# Patient Record
Sex: Female | Born: 2000 | Race: White | Hispanic: No | Marital: Single | Smoking: Never smoker
Health system: Southern US, Community
[De-identification: ages and names within clinical notes are randomized; demographics above are authoritative.]

## PROBLEM LIST (undated history)

## (undated) DIAGNOSIS — E785 Hyperlipidemia, unspecified: Secondary | ICD-10-CM

## (undated) DIAGNOSIS — Z789 Other specified health status: Secondary | ICD-10-CM

## (undated) DIAGNOSIS — F419 Anxiety disorder, unspecified: Secondary | ICD-10-CM

## (undated) HISTORY — DX: Hyperlipidemia, unspecified: E78.5

## (undated) HISTORY — DX: Anxiety disorder, unspecified: F41.9

## (undated) HISTORY — DX: Other specified health status: Z78.9

## (undated) HISTORY — PX: WISDOM TOOTH EXTRACTION: SHX21

---
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
## 2020-01-03 ENCOUNTER — Telehealth (INDEPENDENT_AMBULATORY_CARE_PROVIDER_SITE_OTHER): Payer: BC Managed Care – PPO | Admitting: Pediatrics

## 2020-01-03 DIAGNOSIS — F4323 Adjustment disorder with mixed anxiety and depressed mood: Secondary | ICD-10-CM | POA: Diagnosis not present

## 2020-01-03 DIAGNOSIS — F5000 Anorexia nervosa, unspecified: Secondary | ICD-10-CM | POA: Diagnosis not present

## 2020-01-03 DIAGNOSIS — G479 Sleep disorder, unspecified: Secondary | ICD-10-CM

## 2020-01-03 MED ORDER — HYDROXYZINE HCL 25 MG PO TABS: 25.0000 mg | ORAL_TABLET | Freq: Three times a day (TID) | ORAL | 1 refills | Status: DC | PRN

## 2020-01-03 MED ORDER — FLUOXETINE HCL 40 MG PO CAPS: 40.0000 mg | ORAL_CAPSULE | Freq: Every day | ORAL | 1 refills | Status: DC

## 2020-01-03 NOTE — Progress Notes (Signed)
THIS RECORD MAY CONTAIN CONFIDENTIAL INFORMATION THAT SHOULD NOT BE RELEASED WITHOUT REVIEW OF THE SERVICE PROVIDER.  Virtual Follow-Up Visit via Video Note  I connected with Melody [name] 's mother and patient  on 01/03/20 at  4:00 PM EDT by a video enabled telemedicine application and verified that I am speaking with the correct person using two identifiers.   Patient/parent location: home   I discussed the limitations of evaluation and management by telemedicine and the availability of in person appointments.  I discussed that the purpose of this telehealth visit is to provide medical care while limiting exposure to the novel coronavirus.  The mother and patient expressed understanding and agreed to proceed.   Melody [name] is a 19 y.o. female referred by Chrys Racer, MD here today for follow-up of anorexia, anxiety, sleep disturbance.  Previsit planning completed:  yes   History was provided by the patient and mother.  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   When she is at home she is fine, but when she leaves the house, she has significant nausea and feels like she will hyperventilate. Mom says they tried to go somewhere in [place], and nearly had to pull over the car she was such a mess.   Sleeping pretty well- still night sweats. Sometimes lays awake but not frequently. Still wakes a few times, but falls asleep.   Food has been "pretty good." Things somewhat thrown off with school being restarted back in person- she is not able to snack like she was before. She doesn't feel like she's avoiding eating or missing meals.   24 hour recall:  D: baked potato, butter, cheese, [place]  S: dried cereal with m&ms B: yogurt and bagel thin with butter  L: oranges, [place], peas Handful of m&ms  Has felt like this  is the right amount.   Has been exercising and doing weight lifting most days. Wants to be back in shape for sports. Will drink chocolate milk and protein after, and then dinner.   Periods "come whenever they want to." Sometimes once a month, sometimes every other week. Fairly light. Period about 1 month ago, and then started again.   PHQ-SADS Last 3 Score only 01/03/2020 10/27/2017 08/27/2017  PHQ-15 Score 7 3 8   Total GAD-7 Score 12 0 8  Score 0 2 3      Review of Systems  Constitutional: Negative for malaise/fatigue.  Eyes: Negative for double vision.  Respiratory: Negative for shortness of breath.   Cardiovascular: Negative for chest pain and palpitations.  Gastrointestinal: Negative for abdominal pain, constipation, diarrhea, nausea and vomiting.  Genitourinary: Negative for dysuria.  Musculoskeletal: Negative for joint pain and myalgias.  Skin: Negative for rash.  Neurological: Positive for headaches. Negative for dizziness.  Endo/Heme/Allergies: Does not bruise/bleed easily.  Psychiatric/Behavioral: The patient is nervous/anxious and has insomnia.      No Known Allergies Outpatient Medications Prior to Visit  Medication Sig Dispense Refill  . FLUoxetine (PROZAC) 20 MG capsule TAKE 1 CAPSULE DAILY 90 capsule 3  . ibuprofen (ADVIL,MOTRIN) 200 MG tablet Take 200 mg by mouth every 6 (six) hours as needed.     No facility-administered medications prior to visit.     Patient Active Problem List   Diagnosis Date Noted  . Sleep  disturbance 05/19/2018  . Headache in pediatric patient 05/19/2018  . Vaginal discharge 08/27/2017  . Disordered eating 01/06/2017  . Adjustment disorder with mixed anxiety and depressed mood 01/06/2017    Tobacco?  no Drugs/ETOH?  no Partner preference?  female Sexually Active?  no  Pregnancy Prevention:  none, reviewed condoms & plan B Trauma currently or in the pastt?  no Suicidal or Self-Harm thoughts?   no Guns in the home?  no  The  following portions of the patient's history were reviewed and updated as appropriate: allergies, current medications, past family history, past medical history, past social history, past surgical history and problem list.  Visual Observations/Objective:   General Appearance: Well nourished well developed, in no apparent distress.  Eyes: conjunctiva no swelling or erythema ENT/Mouth: No hoarseness, No cough for duration of visit.  Neck: Supple  Respiratory: Respiratory effort normal, normal rate, no retractions or distress.   Cardio: Appears well-perfused, noncyanotic Musculoskeletal: no obvious deformity Skin: visible skin without rashes, ecchymosis, erythema Neuro: Awake and oriented X 3,  Psych:  normal affect, Insight and Judgment appropriate.    Assessment/Plan: 1. Adjustment disorder with mixed anxiety and depressed mood Increase fluoxetine to 40 mg daily. She will use hydroxyzine as needed in the meantime. Not currently interested in counseling. She should start to see some benefit in the coming weeks.  - FLUoxetine (PROZAC) 40 MG capsule; Take 1 capsule (40 mg total) by mouth daily.  Dispense: 90 capsule; Refill: 1 - hydrOXYzine (ATARAX/VISTARIL) 25 MG tablet; Take 1 tablet (25 mg total) by mouth 3 (three) times daily as needed.  Dispense: 30 tablet; Refill: 1  2. Anorexia nervosa Stable.   3. Sleep disturbance Stable- will monitor night sweats as we increase medication.     I discussed the assessment and treatment plan with the patient and/or parent/guardian.  They were provided an opportunity to ask questions and all were answered.  They agreed with the plan and demonstrated an understanding of the instructions. They were advised to call back or seek an in-person evaluation in the emergency room if the symptoms worsen or if the condition fails to improve as anticipated.   Follow-up:  4 weeks   Medical decision-making:   I spent 25 minutes on this telehealth visit  inclusive of face-to-face video and care coordination time I was located off site during this encounter.   Jonathon Resides, FNP    CC: Randel Books, Tivis Ringer, MD, Luna Fuse, MD

## 2020-01-04 ENCOUNTER — Encounter: Payer: Self-pay | Admitting: Pediatrics

## 2020-01-29 ENCOUNTER — Other Ambulatory Visit: Payer: Self-pay

## 2020-01-29 ENCOUNTER — Encounter: Payer: Self-pay | Admitting: Pediatrics

## 2020-01-29 ENCOUNTER — Ambulatory Visit (INDEPENDENT_AMBULATORY_CARE_PROVIDER_SITE_OTHER): Payer: BC Managed Care – PPO | Admitting: Pediatrics

## 2020-01-29 VITALS — BP 114/71 | HR 67 | Ht 62.0 in | Wt 100.4 lb

## 2020-01-29 DIAGNOSIS — Z975 Presence of (intrauterine) contraceptive device: Secondary | ICD-10-CM | POA: Diagnosis not present

## 2020-01-29 DIAGNOSIS — G479 Sleep disorder, unspecified: Secondary | ICD-10-CM

## 2020-01-29 DIAGNOSIS — Z1389 Encounter for screening for other disorder: Secondary | ICD-10-CM | POA: Diagnosis not present

## 2020-01-29 DIAGNOSIS — F4323 Adjustment disorder with mixed anxiety and depressed mood: Secondary | ICD-10-CM | POA: Diagnosis not present

## 2020-01-29 DIAGNOSIS — F509 Eating disorder, unspecified: Secondary | ICD-10-CM

## 2020-01-29 LAB — POCT URINALYSIS DIPSTICK
Bilirubin, UA: NEGATIVE
Blood, UA: NEGATIVE
Glucose, UA: NEGATIVE
Ketones, UA: NEGATIVE
Leukocytes, UA: NEGATIVE
Nitrite, UA: NEGATIVE
Protein, UA: NEGATIVE
Spec Grav, UA: 1.01 (ref 1.010–1.025)
Urobilinogen, UA: NEGATIVE E.U./dL — AB
pH, UA: 8 (ref 5.0–8.0)

## 2020-01-29 NOTE — Progress Notes (Signed)
History was provided by the patient and mother.  Melody [name] is a 19 y.o. female who is here for disordered eating, anxiety, sleep disturbance.   Luna Fuse, MD   HPI:  Pt reports that things are fairy good. She hasn't gone much of anywhere except school, but she hasn't felt like vomiting or hyperventilating. Over the weekend was at dad's and drove a friend's house, and didn't worry at all. Hasn't needed hydroxyzine in the past few weeks   She is going to [employer] in the fall. 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 She got a great scholarship.   She is going to [place] on a family vacation. Has had COVID vaccine. Has 3 AP exams this week and 1 in two weeks.   Trying to gain weight by increasing protein and lifting weights at the gym.   Sleeping pretty well. Goes to bed around 11 pm and getting up at 6 am. Sleeping through the night.   Still has nexplanon. It is light, but comes whenever it wants which is a little annoying but not bad. Just spotting basically.   PHQ-SADS Last 3 Score only 01/29/2020 01/03/2020 10/27/2017  PHQ-15 Score 2 7 3   Total GAD-7 Score 3 12 0  Score 1 0 2     Patient's last menstrual period was 01/03/2020.  Review of Systems  Constitutional: Negative for malaise/fatigue.  Eyes: Negative for double vision.  Respiratory: Negative for shortness of breath.   Cardiovascular: Negative for chest pain and palpitations.  Gastrointestinal: Negative for abdominal pain, constipation, diarrhea, nausea and vomiting.  Genitourinary: Negative for dysuria.  Musculoskeletal: Negative for joint pain and myalgias.  Skin: Negative for rash.  Neurological: Negative for dizziness and headaches.  Endo/Heme/Allergies: Does not bruise/bleed easily.    Patient Active Problem List   Diagnosis Date Noted  . Family history of breast cancer 08/04/2019  . Sleep disturbance 05/19/2018  . Headache in pediatric patient 05/19/2018  . Vaginal discharge 08/27/2017  . Disordered  eating 01/06/2017  . Adjustment disorder with mixed anxiety and depressed mood 01/06/2017    Current Outpatient Medications on File Prior to Visit  Medication Sig Dispense Refill  . FLUoxetine (PROZAC) 40 MG capsule Take 1 capsule (40 mg total) by mouth daily. 90 capsule 1  . hydrOXYzine (ATARAX/VISTARIL) 25 MG tablet Take 1 tablet (25 mg total) by mouth 3 (three) times daily as needed. 30 tablet 1  . ibuprofen (ADVIL,MOTRIN) 200 MG tablet Take 200 mg by mouth every 6 (six) hours as needed.     No current facility-administered medications on file prior to visit.    No Known Allergies   Physical Exam:    Vitals:   01/29/20 1521  BP: 114/71  Pulse: 67  Weight: 100 lb 6.4 oz (45.5 kg)  Height: 5\' 2"  (1.575 m)    Blood pressure percentiles are not available for patients who are 18 years or older.  Physical Exam Vitals and nursing note reviewed.  Constitutional:      General: She is not in acute distress.    Appearance: She is well-developed.  Neck:     Thyroid: No thyromegaly.  Cardiovascular:     Rate and Rhythm: Normal rate and regular rhythm.     Heart sounds: No murmur.  Pulmonary:     Breath sounds: Normal breath sounds.  Abdominal:     Palpations: Abdomen is soft. There is no mass.     Tenderness: There is no abdominal tenderness. There is no guarding.  Musculoskeletal:     Right lower leg: No edema.     Left lower leg: No edema.  Lymphadenopathy:     Cervical: No cervical adenopathy.  Skin:    General: Skin is warm.     Findings: No rash.  Neurological:     Mental Status: She is alert.     Comments: No tremor  Psychiatric:        Mood and Affect: Mood and affect normal.     Assessment/Plan: 1. Adjustment disorder with mixed anxiety and depressed mood PHQSADs is much improved today. Continue fluoxetine 40 mg daily and hydroxyzine as needed for significant anxiety.   2. Eating disorder, unspecified type Doing well, is now focused on gaining weight and  muscle appropriately. Discussed major life changes can trigger desire to restrict, so will watch closely.   3. Sleep disturbance Improved with increase in fluoxetine.   4. Nexplanon in place Stable, minor spotting but overall fine.   5. Screening for genitourinary condition WNL  - POCT urinalysis dipstick   Alfonso Ramus, FNP

## 2020-01-29 NOTE — Patient Instructions (Signed)
-

## 2020-04-02 ENCOUNTER — Encounter: Payer: Self-pay | Admitting: Pediatrics

## 2020-04-02 ENCOUNTER — Ambulatory Visit (INDEPENDENT_AMBULATORY_CARE_PROVIDER_SITE_OTHER): Payer: BC Managed Care – PPO | Admitting: Pediatrics

## 2020-04-02 VITALS — BP 116/71 | HR 67 | Ht 62.0 in | Wt 105.0 lb

## 2020-04-02 DIAGNOSIS — Z3046 Encounter for surveillance of implantable subdermal contraceptive: Secondary | ICD-10-CM

## 2020-04-02 DIAGNOSIS — F4323 Adjustment disorder with mixed anxiety and depressed mood: Secondary | ICD-10-CM | POA: Diagnosis not present

## 2020-04-02 DIAGNOSIS — G479 Sleep disorder, unspecified: Secondary | ICD-10-CM

## 2020-04-02 DIAGNOSIS — Z30011 Encounter for initial prescription of contraceptive pills: Secondary | ICD-10-CM

## 2020-04-02 DIAGNOSIS — Z1389 Encounter for screening for other disorder: Secondary | ICD-10-CM

## 2020-04-02 LAB — POCT URINALYSIS DIPSTICK
Bilirubin, UA: NEGATIVE
Blood, UA: NEGATIVE
Glucose, UA: NEGATIVE
Ketones, UA: NEGATIVE
Leukocytes, UA: NEGATIVE
Nitrite, UA: NEGATIVE
Protein, UA: NEGATIVE
Spec Grav, UA: 1.01 (ref 1.010–1.025)
Urobilinogen, UA: NEGATIVE E.U./dL — AB
pH, UA: 5 (ref 5.0–8.0)

## 2020-04-02 MED ORDER — NORETHINDRONE 0.35 MG PO TABS: 1.0000 | ORAL_TABLET | Freq: Every day | ORAL | 0 refills | Status: DC

## 2020-04-02 MED ORDER — FLUOXETINE HCL 40 MG PO CAPS: 40.0000 mg | ORAL_CAPSULE | Freq: Every day | ORAL | 1 refills | Status: DC

## 2020-04-02 MED ORDER — NORETHINDRONE 0.35 MG PO TABS: 1.0000 | ORAL_TABLET | Freq: Every day | ORAL | 2 refills | Status: DC

## 2020-04-02 NOTE — Patient Instructions (Signed)
We will see you in 3 months   Start progesterone only pill- if it is not working well for you and you continue to have unpredictable bleeding, let us know.   Your Nexplanon was removed today and is no longer preventing pregnancy.  If you have sex, remember to use condoms to prevent pregnancy and to prevent sexually transmitted infections.  Leave the outside bandage on for 24 hours.  Leave the smaller bandages on for 3-5 days or until they fall off on their own.  Keep the area clean and dry for 3-5 days.  There is usually bruising or swelling at and around the removal site for a few days to a week after the removal.  If you see redness or pus draining from the removal site, call us immediately.  We would like you to return to the clinic for a follow-up visit in 1 month.  You can call [facility] 24 hours a day with any questions or concerns.  There is always a nurse or doctor available to take your call.  Call 9-1-1 if you have a life-threatening emergency.  For anything else, please call us at [phone] before heading to the ER.

## 2020-04-02 NOTE — Progress Notes (Signed)

## 2020-04-02 NOTE — Progress Notes (Signed)
This note is not being shared with the patient for the following reason: To respect privacy (The patient or proxy has requested that the information not be shared).  THIS RECORD MAY CONTAIN CONFIDENTIAL INFORMATION THAT SHOULD NOT BE RELEASED WITHOUT REVIEW OF THE SERVICE PROVIDER.  Adolescent Medicine Consultation Follow-Up Visit Melody [name]  is a 19 y.o. female referred by Luna Fuse, MD here today for follow-up regarding medication follow up.   Pertinent Labs? No Growth Chart Viewed? yes   History was provided by the patient and mother.  Interpreter? no  Chief complaint: Medication follow up   HPI:    1. Wants Nexplanon remove Bleeding three times a month Bleeding for a week, then bleed for 4 days a week later, 2 days another week.  Periods are very light Unsure when period going to come and how long going to last  No pattern Nexplanon placed June 5th 2020,  Has never been regular since placement, sometimes coming a few days before Has been worsening in the past 6 months Unsure incident event that changed   Never been sexually active. No changes in vaginal discharge or odor. No itching.   She is interested in OCPs. Does have history of migraine with aura. No history of DVT in family. Maternal grandmother with breast cancer. No smoking.   Mom and aunt had problems with IUD. No interest in this.   2. Anxiety: Currently on Floxetine 54m. Has only needed hydroxyzine on long car trips as prophlatic. Feels like current dose is doing well. No community therapy. Has tried in the past did not like.   3. Sleep Disturbance: Sleeping well. Falling asleep and staying asleep.   [facility] excited and nervous. Met room mate. 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     No LMP recorded. No Known Allergies Current Outpatient Medications on File Prior to Visit  Medication Sig Dispense Refill  . etonogestrel (NEXPLANON) 68 MG IMPL  implant Inject into the skin.    . hydrOXYzine (ATARAX/VISTARIL) 25 MG tablet Take 1 tablet (25 mg total) by mouth 3 (three) times daily as needed. 30 tablet 1  . ibuprofen (ADVIL,MOTRIN) 200 MG tablet Take 200 mg by mouth every 6 (six) hours as needed.    .Marland KitchenSpecialty Vitamins Products (MAGNESIUM, AMINO ACID CHELATE,) 133 MG tablet Take by mouth.     No current facility-administered medications on file prior to visit.    Patient Active Problem List   Diagnosis Date Noted  . Nexplanon in place 01/29/2020  . Family history of breast cancer 08/04/2019  . Sleep disturbance 05/19/2018  . Headache in pediatric patient 05/19/2018  . Vaginal discharge 08/27/2017  . Disordered eating 01/06/2017  . Adjustment disorder with mixed anxiety and depressed mood 01/06/2017    Physical Exam:  Vitals:   04/02/20 1023  BP: 116/71  Pulse: 67  Weight: 105 lb (47.6 kg)  Height: _0  (1.575 m)   BP 116/71   Pulse 67   Ht _1  (1.575 m)   Wt 105 lb (47.6 kg)   BMI 19.20 kg/m  Body mass index: body mass index is 19.2 kg/m. Blood pressure percentiles are not available for patients who are 18 years or older.   Physical Exam  General: Alert, well-appearing female in NAD.  HEENT:              Head: Normocephalic, No signs of head trauma  Eyes: Sclerae are anicteric             Nose: clear             Throat: Good dentition, Moist mucous membranes.Oropharynx clear with no erythema or exudate Neck: normal range of motion, no lymphadenopathy, no thyromegaly Cardiovascular: Regular rate and rhythm, S1 and S2 normal. No murmur, rub, or gallop appreciated. Radial pulse +2 bilaterally Pulmonary: Normal work of breathing. Clear to auscultation bilaterally with no wheezes or crackles present, Cap refill <2 secs Abdomen: Normoactive bowel sounds. Soft, non-tender, non-distended. Extremities: Warm and well-perfused, without cyanosis or edema. Full ROM. Nexplanon present in left forearm.   Neurologic: No focal deficits appreciated Skin: acne vulgaris present on face  Assessment/Plan:  1. Adjustment disorder with mixed anxiety and depressed mood Good control of symptoms on current dose.  - FLUoxetine (PROZAC) 40 MG capsule; Take 1 capsule (40 mg total) by mouth daily.  Dispense: 90 capsule; Refill: 1  2. Screening for genitourinary condition - POCT urinalysis dipstick: negative  3. Nexplanon removal See procedural note.   4. Sleep disturbance Improved on current Fluoxetine dose. Has hydroxyzine prn. Has not needed to use dose.   5. Oral contraception initiation History of migraine with aura, will therefore avoid estrogen and progesterone combined OCP. Plan to start Progestin only. Education provided prior to initiation. BP wnl.  - norethindrone (CAMILA) 0.35 MG tablet; Take 1 tablet (0.35 mg total) by mouth daily.  Dispense: 30 tablet; Refill: 0 - norethindrone (CAMILA) 0.35 MG tablet; Take 1 tablet (0.35 mg total) by mouth daily.  Dispense: 90 tablet; Refill: 2    Follow-up:  Return in about 3 months (around 07/03/2020).

## 2020-04-03 NOTE — Progress Notes (Signed)
I have reviewed the resident's note and plan of care and helped develop the plan as necessary.  Undine is not a good candidate for E2 containing methods d/t migraine with aura. Can start with camila- consider drosperinone progestin only if needed if she becomes sexually active or is having btb.   Alfonso Ramus, FNP

## 2020-05-06 ENCOUNTER — Ambulatory Visit: Payer: Self-pay | Admitting: Pediatrics

## 2020-06-17 DIAGNOSIS — Z23 Encounter for immunization: Secondary | ICD-10-CM | POA: Diagnosis not present

## 2020-07-03 ENCOUNTER — Telehealth (INDEPENDENT_AMBULATORY_CARE_PROVIDER_SITE_OTHER): Payer: BC Managed Care – PPO | Admitting: Pediatrics

## 2020-07-03 DIAGNOSIS — F4323 Adjustment disorder with mixed anxiety and depressed mood: Secondary | ICD-10-CM

## 2020-07-03 DIAGNOSIS — G479 Sleep disorder, unspecified: Secondary | ICD-10-CM | POA: Diagnosis not present

## 2020-07-03 DIAGNOSIS — F509 Eating disorder, unspecified: Secondary | ICD-10-CM

## 2020-07-03 DIAGNOSIS — Z975 Presence of (intrauterine) contraceptive device: Secondary | ICD-10-CM | POA: Diagnosis not present

## 2020-07-03 DIAGNOSIS — R5383 Other fatigue: Secondary | ICD-10-CM

## 2020-07-03 DIAGNOSIS — K121 Other forms of stomatitis: Secondary | ICD-10-CM

## 2020-07-03 NOTE — Progress Notes (Signed)
THIS RECORD MAY CONTAIN CONFIDENTIAL INFORMATION THAT SHOULD NOT BE RELEASED WITHOUT REVIEW OF THE SERVICE PROVIDER.  Virtual Follow-Up Visit via Video Note  I connected with Melody [name] 's patient  on 07/03/20 at  1:30 PM EDT by a video enabled telemedicine application and verified that I am speaking with the correct person using two identifiers.   Patient/parent location: [place] dorm room   I discussed the limitations of evaluation and management by telemedicine and the availability of in person appointments.  I discussed that the purpose of this telehealth visit is to provide medical care while limiting exposure to the novel coronavirus.  The patient expressed understanding and agreed to proceed.   Melody [name] is a 19 y.o. female referred by Luna Fuse, MD here today for follow-up of anxiety, disordered eating.  Previsit planning completed:  yes   History was provided by the patient.  Plan from Last Visit:   Continue fluxoetine 40 mg daily, nexplanon and camila for BTB  Chief Complaint: F/u fatigue   History of Present Illness:  Is at [facility]. Is tired a lot. Taking naps during the day which she was not doing before. She goes to sleep easily, sometimes even with the lights on. Sleeping through the night. Going to bed 11-12 pm and getting up about 7 am. In high school went to bed earlier. Fatigue has been going on since school started.   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   24 hour recall:  B: mini pancakes  L: bagel  D: fruit snacks, mac and cheese, m&ms B: none  L: pretzel rolls, cheese, grapes and desert for lunch Drinking mostly water, 1 cup of coffee in the AM   Recognizes that she is missing a good bit of protein, fruits and veg.    Review of Systems  Constitutional: Positive for malaise/fatigue. Negative for fever and weight loss.  HENT: Positive for sore throat.   Eyes: Negative for double vision.   Respiratory: Positive for shortness of breath.   Cardiovascular: Negative for chest pain and palpitations.  Gastrointestinal: Negative for abdominal pain, constipation, diarrhea, nausea and vomiting.  Genitourinary: Negative for dysuria and frequency.  Musculoskeletal: Negative for joint pain and myalgias.  Skin: Negative for rash.  Neurological: Positive for headaches. Negative for dizziness.  Endo/Heme/Allergies: Does not bruise/bleed easily.  Psychiatric/Behavioral: Negative for depression. The patient is not nervous/anxious and does not have insomnia.      No Known Allergies Outpatient Medications Prior to Visit  Medication Sig Dispense Refill  . etonogestrel (NEXPLANON) 68 MG IMPL implant Inject into the skin.    Marland Kitchen FLUoxetine (PROZAC) 40 MG capsule Take 1 capsule (40 mg total) by mouth daily. 90 capsule 1  . hydrOXYzine (ATARAX/VISTARIL) 25 MG tablet Take 1 tablet (25 mg total) by mouth 3 (three) times daily as needed. 30 tablet 1  . ibuprofen (ADVIL,MOTRIN) 200 MG tablet Take 200 mg by mouth every 6 (six) hours as needed.    . norethindrone (CAMILA) 0.35 MG tablet Take 1 tablet (0.35 mg total) by mouth daily. 30 tablet 0  . norethindrone (CAMILA) 0.35 MG tablet Take 1 tablet (0.35 mg total) by mouth daily. 90 tablet 2  . Specialty Vitamins Products (MAGNESIUM, AMINO ACID CHELATE,) 133 MG tablet Take by mouth.     No facility-administered medications prior to visit.     Patient Active Problem List   Diagnosis Date Noted  . Nexplanon  in place 01/29/2020  . Family history of breast cancer 08/04/2019  . Sleep disturbance 05/19/2018  . Headache in pediatric patient 05/19/2018  . Vaginal discharge 08/27/2017  . Disordered eating 01/06/2017  . Adjustment disorder with mixed anxiety and depressed mood 01/06/2017    The following portions of the patient's history were reviewed and updated as appropriate: allergies, current medications, past family history, past medical history,  past social history, past surgical history and problem list.  Visual Observations/Objective:   General Appearance: Well nourished well developed, in no apparent distress.  Eyes: conjunctiva no swelling or erythema ENT/Mouth: No hoarseness, No cough for duration of visit.  Neck: Supple  Respiratory: Respiratory effort normal, normal rate, no retractions or distress.   Cardio: Appears well-perfused, noncyanotic Musculoskeletal: no obvious deformity Skin: visible skin without rashes, ecchymosis, erythema Neuro: Awake and oriented X 3,  Psych:  normal affect, Insight and Judgment appropriate.    Assessment/Plan: 1. Adjustment disorder with mixed anxiety and depressed mood PHQSADs is negative and stable. Continue fluoxetine 40 mg daily.   2. Sleep disturbance Sleeping well at night now overall.   3. Nexplanon in place Doing well without BTB with camila on board.   4. Eating disorder, unspecified type Though she doesn't seem to be intentionally restricting, her recall indicates that she is not getting enough intake, particularly of protein and fats. Suggested keeping clif bar and whole milk yogurt or greek yogurt in her room for daily breakfast since she usually misses this. Also suggested considering meal pattern to follow, but wants to focus on breakfast first.   5. Fatigue, unspecified type Will get labs to ensure she is not deficient in b12, ferritin or vit d which could be contributing to fatigue.  - B12 - Ferritin - VITAMIN D 25 Hydroxy (Vit-D Deficiency, Fractures)  6. Oral ulcer Likely apthous ulcer but will check B12 since she very rarely has red meat.  - B12   BH screenings:  PHQ-SADS Last 3 Score only 01/29/2020 01/03/2020 10/27/2017  PHQ-15 Score _0 Total GAD-7 Score 3 12 0  PHQ-9 Total Score 1 0 2    Screens discussed with patient and parent and adjustments to plan made accordingly.   I discussed the assessment and treatment plan with the patient and/or  parent/guardian.  They were provided an opportunity to ask questions and all were answered.  They agreed with the plan and demonstrated an understanding of the instructions. They were advised to call back or seek an in-person evaluation in the emergency room if the symptoms worsen or if the condition fails to improve as anticipated.   Follow-up:  Thanksgiving break in clinic   Medical decision-making:   I spent 25 minutes on this telehealth visit inclusive of face-to-face video and care coordination time I was located in clinic during this encounter.   Jonathon Resides, FNP    CC: Randel Books, Tivis Ringer, MD, Luna Fuse, MD

## 2020-08-20 ENCOUNTER — Ambulatory Visit (INDEPENDENT_AMBULATORY_CARE_PROVIDER_SITE_OTHER): Payer: BC Managed Care – PPO | Admitting: Pediatrics

## 2020-08-20 ENCOUNTER — Encounter: Payer: Self-pay | Admitting: Pediatrics

## 2020-08-20 ENCOUNTER — Other Ambulatory Visit (HOSPITAL_COMMUNITY)
Admission: RE | Admit: 2020-08-20 | Discharge: 2020-08-20 | Disposition: A | Discharge status: Home / Self Care | Payer: BC Managed Care – PPO | Source: Ambulatory Visit | Attending: Pediatrics | Admitting: Pediatrics

## 2020-08-20 VITALS — BP 114/62 | HR 72 | Ht 62.0 in | Wt 107.0 lb

## 2020-08-20 DIAGNOSIS — N939 Abnormal uterine and vaginal bleeding, unspecified: Secondary | ICD-10-CM

## 2020-08-20 DIAGNOSIS — R5383 Other fatigue: Secondary | ICD-10-CM | POA: Diagnosis not present

## 2020-08-20 DIAGNOSIS — F4323 Adjustment disorder with mixed anxiety and depressed mood: Secondary | ICD-10-CM | POA: Diagnosis not present

## 2020-08-20 DIAGNOSIS — Z113 Encounter for screening for infections with a predominantly sexual mode of transmission: Secondary | ICD-10-CM | POA: Insufficient documentation

## 2020-08-20 DIAGNOSIS — B349 Viral infection, unspecified: Secondary | ICD-10-CM | POA: Diagnosis not present

## 2020-08-20 MED ORDER — FLUTICASONE PROPIONATE 50 MCG/ACT NA SUSP: 2.0000 | Freq: Every day | NASAL | 1 refills | Status: DC

## 2020-08-20 MED ORDER — FLUOXETINE HCL 40 MG PO CAPS: 40.0000 mg | ORAL_CAPSULE | Freq: Every day | ORAL | 1 refills | Status: DC

## 2020-08-20 MED ORDER — BENZONATATE 100 MG PO CAPS: 100.0000 mg | ORAL_CAPSULE | Freq: Three times a day (TID) | ORAL | 0 refills | Status: DC | PRN

## 2020-08-20 NOTE — Progress Notes (Signed)
History was provided by the patient and mother.   Melody [name] is a 19 y.o. female who is here for fatigue, anxiety, menstrual irregularity.  Chrys Racer, MD   HPI:  Pt reports that her period is "off the walls." it came 3 times in a month and doesn't know when it is going to come or how long it is going to last. Has been also sick for about 3 weeks now  nexplanon was removed in July. Periods were regular but then has had lots of spotting.   Started with sore throat, congestion, fatigue, cough. Denies fever. Still has cough, congestion, runny nose. Has been tested for covid 4 times and was negative. 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 Sometimes coughing up stuff, sometimes dry. Sleeping generally well if she doesn't wake up coughing. Did have some purulent areas in her throat in addition to swelling.   PHQ-SADS Last 3 Score only 08/25/2020 07/03/2020 01/29/2020  PHQ-15 Score 2 6 2   Total GAD-7 Score 0 3 3  PHQ-9 Total Score 1 4 1      No LMP recorded.  Review of Systems  Constitutional: Positive for malaise/fatigue.  HENT: Positive for congestion, sinus pain and sore throat.   Eyes: Negative for double vision.  Respiratory: Positive for cough. Negative for shortness of breath.   Cardiovascular: Negative for chest pain and palpitations.  Gastrointestinal: Negative for abdominal pain, constipation, diarrhea, nausea and vomiting.  Genitourinary: Negative for dysuria.  Musculoskeletal: Negative for joint pain and myalgias.  Skin: Negative for rash.  Neurological: Positive for headaches. Negative for dizziness.  Endo/Heme/Allergies: Does not bruise/bleed easily.    Patient Active Problem List   Diagnosis Date Noted  . Nexplanon in place 01/29/2020  . Family history of breast cancer 08/04/2019  . Sleep disturbance 05/19/2018  . Headache in pediatric patient 05/19/2018  . Vaginal discharge 08/27/2017  . Disordered eating 01/06/2017  . Adjustment  disorder with mixed anxiety and depressed mood 01/06/2017    Current Outpatient Medications on File Prior to Visit  Medication Sig Dispense Refill  . FLUoxetine (PROZAC) 40 MG capsule Take 1 capsule (40 mg total) by mouth daily. 90 capsule 1  . hydrOXYzine (ATARAX/VISTARIL) 25 MG tablet Take 1 tablet (25 mg total) by mouth 3 (three) times daily as needed. 30 tablet 1  . ibuprofen (ADVIL,MOTRIN) 200 MG tablet Take 200 mg by mouth every 6 (six) hours as needed.    . norethindrone (CAMILA) 0.35 MG tablet Take 1 tablet (0.35 mg total) by mouth daily. 90 tablet 2  . Specialty Vitamins Products (MAGNESIUM, AMINO ACID CHELATE,) 133 MG tablet Take by mouth.    . etonogestrel (NEXPLANON) 68 MG IMPL implant Inject into the skin. (Patient not taking: Reported on 08/20/2020)     No current facility-administered medications on file prior to visit.    No Known Allergies  Physical Exam:    Vitals:   08/20/20 1000  BP: 114/62  Pulse: 72  Weight: 107 lb (48.5 kg)  Height: 5\' 3"  (1.6 m)    Blood pressure percentiles are not available for patients who are 18 years or older.  Physical Exam Vitals and nursing note reviewed.  Constitutional:      General: She is not in acute distress.    Appearance: She is well-developed.  HENT:     Right Ear: Ear canal normal. A middle ear effusion is present. Tympanic membrane is not erythematous or bulging.     Left Ear:  Ear canal normal. A middle ear effusion is present. Tympanic membrane is not erythematous or bulging.     Nose: Mucosal edema, congestion and rhinorrhea present.     Mouth/Throat:     Pharynx: No posterior oropharyngeal erythema.     Tonsils: No tonsillar exudate or tonsillar abscesses.     Comments: Tonsil stones Neck:     Thyroid: No thyromegaly.  Cardiovascular:     Rate and Rhythm: Normal rate and regular rhythm.     Heart sounds: No murmur heard.   Pulmonary:     Breath sounds: Normal breath sounds.  Abdominal:     Palpations:  Abdomen is soft. There is no hepatomegaly, splenomegaly or mass.     Tenderness: There is no abdominal tenderness. There is no guarding.  Musculoskeletal:     Right lower leg: No edema.     Left lower leg: No edema.  Lymphadenopathy:     Cervical: No cervical adenopathy.  Skin:    General: Skin is warm.     Findings: No rash.  Neurological:     Mental Status: She is alert.     Comments: No tremor     Assessment/Plan: 1. Adjustment disorder with mixed anxiety and depressed mood Continue fluoxetine 40 mg daily. No panic attacks since last visit and phqsads good today.  - FLUoxetine (PROZAC) 40 MG capsule; Take 1 capsule (40 mg total) by mouth daily.  Dispense: 90 capsule; Refill: 1  2. Fatigue, unspecified type Get labs today. Has overall improved, although she has had viral illness for the past few weeks.  - VITAMIN D 25 Hydroxy (Vit-D Deficiency, Fractures) - Ferritin - B12  3. Viral illness Will ensure that she does not have mono given she is very active in college sport. No HSM on exam today.  - Epstein-Barr virus VCA antibody panel - benzonatate (TESSALON PERLES) 100 MG capsule; Take 1 capsule (100 mg total) by mouth 3 (three) times daily as needed for cough.  Dispense: 20 capsule; Refill: 0 - fluticasone (FLONASE) 50 MCG/ACT nasal spray; Place 2 sprays into both nostrils daily.  Dispense: 16 g; Refill: 1  4. Vaginal spotting Will check TSH and prl to ensure no cause of irregularity, however, I suspect it is still r/t removal of nexplanon and stress r/t illness.  - TSH - Prolactin  5. Routine screening for STI (sexually transmitted infection) Per protocol.  - Urine cytology ancillary only - HIV antibody (with reflex)  Return as needed   Alfonso Ramus, FNP

## 2020-08-20 NOTE — Patient Instructions (Signed)
Tessalon perles 100 mg three times daily as needed  flonase 2 spray each nose daily  Airborne   Labs today- will send results on mychart

## 2020-08-21 LAB — VITAMIN B12: Vitamin B-12: 642 pg/mL (ref 200–1100)

## 2020-08-21 LAB — EPSTEIN-BARR VIRUS VCA ANTIBODY PANEL
EBV NA IgG: <18 U/mL
EBV VCA IgG: <18 U/mL
EBV VCA IgM: <36 U/mL

## 2020-08-21 LAB — VITAMIN D 25 HYDROXY (VIT D DEFICIENCY, FRACTURES): Vit D, 25-Hydroxy: 27 ng/mL — ABNORMAL LOW (ref 30–100)

## 2020-08-21 LAB — TSH: TSH: 0.6 mIU/L

## 2020-08-21 LAB — EXTRA SPECIMEN

## 2020-08-21 LAB — URINE CYTOLOGY ANCILLARY ONLY
Chlamydia: NEGATIVE
Comment: NEGATIVE
Comment: NORMAL
Neisseria Gonorrhea: NEGATIVE

## 2020-08-21 LAB — PROLACTIN: Prolactin: 9.3 ng/mL

## 2020-08-21 LAB — FERRITIN: Ferritin: 32 ng/mL (ref 16–154)

## 2020-08-21 LAB — HIV ANTIBODY (ROUTINE TESTING W REFLEX): HIV 1&2 Ab, 4th Generation: NONREACTIVE

## 2020-09-11 ENCOUNTER — Other Ambulatory Visit: Payer: Self-pay | Admitting: Pediatrics

## 2020-09-11 DIAGNOSIS — B349 Viral infection, unspecified: Secondary | ICD-10-CM

## 2020-09-25 ENCOUNTER — Other Ambulatory Visit: Payer: Self-pay | Admitting: Pediatrics

## 2020-09-25 DIAGNOSIS — B349 Viral infection, unspecified: Secondary | ICD-10-CM

## 2020-12-30 ENCOUNTER — Other Ambulatory Visit: Payer: Self-pay | Admitting: Pediatrics

## 2020-12-30 MED ORDER — HEATHER 0.35 MG PO TABS: 1.0000 | ORAL_TABLET | Freq: Every day | ORAL | 3 refills | Status: DC

## 2021-04-23 DIAGNOSIS — J040 Acute laryngitis: Secondary | ICD-10-CM | POA: Diagnosis not present

## 2021-04-23 DIAGNOSIS — J029 Acute pharyngitis, unspecified: Secondary | ICD-10-CM | POA: Diagnosis not present

## 2021-04-23 DIAGNOSIS — Z20822 Contact with and (suspected) exposure to covid-19: Secondary | ICD-10-CM | POA: Diagnosis not present

## 2021-07-21 ENCOUNTER — Other Ambulatory Visit: Payer: Self-pay | Admitting: Pediatrics

## 2021-07-21 DIAGNOSIS — F4323 Adjustment disorder with mixed anxiety and depressed mood: Secondary | ICD-10-CM

## 2021-07-21 DIAGNOSIS — Z23 Encounter for immunization: Secondary | ICD-10-CM | POA: Diagnosis not present

## 2021-07-21 MED ORDER — FLUOXETINE HCL 40 MG PO CAPS: 40.0000 mg | ORAL_CAPSULE | Freq: Every day | ORAL | 0 refills | Status: DC

## 2021-08-14 ENCOUNTER — Other Ambulatory Visit: Payer: Self-pay | Admitting: Pediatrics

## 2021-08-14 DIAGNOSIS — F4323 Adjustment disorder with mixed anxiety and depressed mood: Secondary | ICD-10-CM

## 2021-08-19 ENCOUNTER — Other Ambulatory Visit: Payer: Self-pay

## 2021-08-19 ENCOUNTER — Ambulatory Visit (INDEPENDENT_AMBULATORY_CARE_PROVIDER_SITE_OTHER): Payer: BC Managed Care – PPO | Admitting: Family

## 2021-08-19 VITALS — BP 103/63 | HR 63 | Ht 62.21 in | Wt 114.6 lb

## 2021-08-19 DIAGNOSIS — K5901 Slow transit constipation: Secondary | ICD-10-CM

## 2021-08-19 DIAGNOSIS — F4323 Adjustment disorder with mixed anxiety and depressed mood: Secondary | ICD-10-CM

## 2021-08-19 MED ORDER — FLUOXETINE HCL 40 MG PO CAPS: 40.0000 mg | ORAL_CAPSULE | Freq: Every day | ORAL | 1 refills | Status: DC

## 2021-08-19 MED ORDER — POLYETHYLENE GLYCOL 3350 17 GM/SCOOP PO POWD: ORAL | 0 refills | Status: DC

## 2021-08-19 NOTE — Patient Instructions (Signed)

## 2021-08-19 NOTE — Progress Notes (Signed)
History was provided by the patient.  Melody [name] is a 20 y.o. female who is here for adjustment disorder with mixed anxiety and depressed mood.   PCP confirmed? Yes.    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  Fluoxetine 40 mg  OCPs - taking them daily  Thinking about getting an emotional support animal letter for school; cat  Will reach out about ESA letter No SI/HI   PHQ-SADS Last 3 Score only 08/19/2021 08/25/2020 07/03/2020  PHQ-15 Score 4 2 6   Total GAD-7 Score 0 0 3  PHQ Adolescent Score 2 1 4      Patient Active Problem List   Diagnosis Date Noted   Family history of breast cancer 08/04/2019   Sleep disturbance 05/19/2018   Headache in pediatric patient 05/19/2018   Vaginal discharge 08/27/2017   Disordered eating 01/06/2017   Adjustment disorder with mixed anxiety and depressed mood 01/06/2017    Current Outpatient Medications on File Prior to Visit  Medication Sig Dispense Refill   FLUoxetine (PROZAC) 40 MG capsule TAKE 1 CAPSULE (40 MG TOTAL) BY MOUTH DAILY. 90 capsule 1   fluticasone (FLONASE) 50 MCG/ACT nasal spray SPRAY 2 SPRAYS INTO EACH NOSTRIL EVERY DAY 48 mL 1   HEATHER 0.35 MG tablet Take 1 tablet (0.35 mg total) by mouth daily. 84 tablet 3   hydrOXYzine (ATARAX/VISTARIL) 25 MG tablet Take 1 tablet (25 mg total) by mouth 3 (three) times daily as needed. 30 tablet 1   Specialty Vitamins Products (MAGNESIUM, AMINO ACID CHELATE,) 133 MG tablet Take by mouth.     benzonatate (TESSALON PERLES) 100 MG capsule Take 1 capsule (100 mg total) by mouth 3 (three) times daily as needed for cough. (Patient not taking: Reported on 08/19/2021) 20 capsule 0   ibuprofen (ADVIL,MOTRIN) 200 MG tablet Take 200 mg by mouth every 6 (six) hours as needed.     No current facility-administered medications on file prior to visit.    No Known Allergies  Physical Exam:    Vitals:   08/19/21 1344   BP: 103/63  Pulse: 63  Weight: 114 lb 9.6 oz (52 kg)  Height: 5' 2.21" (1.58 m)   Wt Readings from Last 3 Encounters:  08/19/21 114 lb 9.6 oz (52 kg)  08/20/20 107 lb (48.5 kg) (11 %, Z= -1.20)*  04/02/20 105 lb (47.6 kg) (9 %, Z= -1.32)*   * Growth percentiles are based on CDC (Girls, 2-20 Years) data.     Growth percentile SmartLinks can only be used for patients less than 20 years old. No LMP recorded.  Physical Exam Vitals reviewed.  Constitutional:      Appearance: Normal appearance. She is not toxic-appearing.  HENT:     Head: Normocephalic.     Mouth/Throat:     Pharynx: Oropharynx is clear.  Eyes:     General: No scleral icterus.    Extraocular Movements: Extraocular movements intact.     Pupils: Pupils are equal, round, and reactive to light.  Cardiovascular:     Rate and Rhythm: Normal rate and regular rhythm.     Heart sounds: No murmur heard. Pulmonary:     Effort: Pulmonary effort is normal.  Abdominal:     General: Abdomen is flat. There is no distension.     Palpations: Abdomen is soft.     Tenderness: There is no abdominal tenderness.  Musculoskeletal:  General: No swelling. Normal range of motion.     Cervical back: Normal range of motion and neck supple.  Lymphadenopathy:     Cervical: No cervical adenopathy.  Skin:    General: Skin is warm and dry.     Capillary Refill: Capillary refill takes less than 2 seconds.     Findings: No rash.  Neurological:     General: No focal deficit present.     Mental Status: She is alert and oriented to person, place, and time.  Psychiatric:        Mood and Affect: Mood normal.     Assessment/Plan: 1. Adjustment disorder with mixed anxiety and depressed mood - FLUoxetine (PROZAC) 40 MG capsule; Take 1 capsule (40 mg total) by mouth daily.  Dispense: 90 capsule; Refill: 1 -continue with fluoxetine 40 mg daily  -3 months video or in person  2. Slow Transit Constipation  -Miralax clean out instructions  reviewed, including daily maintenance dose 1-2 capfuls

## 2021-08-20 ENCOUNTER — Encounter: Payer: Self-pay | Admitting: Family

## 2021-11-12 ENCOUNTER — Telehealth (INDEPENDENT_AMBULATORY_CARE_PROVIDER_SITE_OTHER): Payer: BC Managed Care – PPO | Admitting: Family

## 2021-11-12 ENCOUNTER — Encounter: Payer: Self-pay | Admitting: Family

## 2021-11-12 DIAGNOSIS — K5901 Slow transit constipation: Secondary | ICD-10-CM

## 2021-11-12 DIAGNOSIS — F4323 Adjustment disorder with mixed anxiety and depressed mood: Secondary | ICD-10-CM | POA: Diagnosis not present

## 2021-11-12 DIAGNOSIS — Z3041 Encounter for surveillance of contraceptive pills: Secondary | ICD-10-CM

## 2021-11-12 MED ORDER — FLUOXETINE HCL 40 MG PO CAPS: 40.0000 mg | ORAL_CAPSULE | Freq: Every day | ORAL | 1 refills | Status: DC

## 2021-11-12 MED ORDER — HYDROXYZINE HCL 50 MG PO TABS: ORAL_TABLET | ORAL | 0 refills | Status: AC

## 2021-11-12 MED ORDER — HEATHER 0.35 MG PO TABS: 1.0000 | ORAL_TABLET | Freq: Every day | ORAL | 3 refills | Status: DC

## 2021-11-12 NOTE — Progress Notes (Signed)
THIS RECORD MAY CONTAIN CONFIDENTIAL INFORMATION THAT SHOULD NOT BE RELEASED WITHOUT REVIEW OF THE SERVICE PROVIDER.  Virtual Follow-Up Visit via Video Note  I connected with Melody [name]  on 11/12/21 at  1:30 PM EST by a video enabled telemedicine application and verified that I am speaking with the correct person using two identifiers.   Patient/parent location: dorm, [place]   I discussed the limitations of evaluation and management by telemedicine and the availability of in person appointments.  I discussed that the purpose of this telehealth visit is to provide medical care while limiting exposure to the novel coronavirus.  The patient expressed understanding and agreed to proceed.   Melody [name] is a 21 y.o. female referred by Chrys Racer, MD here today for follow-up of adjustment disorder with mixed anxiety and depressed mood, constipation.   History was provided by the patient.  Supervising Physician: Dr. Delorse Lek  Plan from Last Visit:   1. Adjustment disorder with mixed anxiety and depressed mood - FLUoxetine (PROZAC) 40 MG capsule; Take 1 capsule (40 mg total) by mouth daily.  Dispense: 90 capsule; Refill: 1 -continue with fluoxetine 40 mg daily  -3 months video or in person  2. Slow Transit Constipation  -Miralax clean out instructions reviewed, including daily maintenance dose 1-2 capfuls   Chief Complaint: Adjustment disorder with mixed anxiety and depressed mood  History of Present Illness:  -wants to get refill on hydroxyzine for anxiety with travel - has trip to [place] planned in 2 weeks; took 25 mg and did nothing last year and hyperventilated on bus  -going to apply for ESA for coming school animal - her cat from home  -otherwise things going well, constipation has improved with more fruit in diet -is on last pack for birth control pills, needs refill   No Known Allergies Outpatient Medications Prior to Visit  Medication Sig Dispense Refill    FLUoxetine (PROZAC) 40 MG capsule Take 1 capsule (40 mg total) by mouth daily. 90 capsule 1   fluticasone (FLONASE) 50 MCG/ACT nasal spray SPRAY 2 SPRAYS INTO EACH NOSTRIL EVERY DAY 48 mL 1   HEATHER 0.35 MG tablet Take 1 tablet (0.35 mg total) by mouth daily. 84 tablet 3   hydrOXYzine (ATARAX/VISTARIL) 25 MG tablet Take 1 tablet (25 mg total) by mouth 3 (three) times daily as needed. 30 tablet 1   ibuprofen (ADVIL,MOTRIN) 200 MG tablet Take 200 mg by mouth every 6 (six) hours as needed.     polyethylene glycol powder (GLYCOLAX/MIRALAX) 17 GM/SCOOP powder Add one scoop to 8-10 ounces of water and drink daily. 255 g 0   Specialty Vitamins Products (MAGNESIUM, AMINO ACID CHELATE,) 133 MG tablet Take by mouth.     No facility-administered medications prior to visit.     Patient Active Problem List   Diagnosis Date Noted   Family history of breast cancer 08/04/2019   Sleep disturbance 05/19/2018   Headache in pediatric patient 05/19/2018   Vaginal discharge 08/27/2017   Disordered eating 01/06/2017   Adjustment disorder with mixed anxiety and depressed mood 01/06/2017  The following portions of the patient's history were reviewed and updated as appropriate: allergies, current medications, past family history, past medical history, past social history, past surgical history, and problem list.  Visual Observations/Objective:   General Appearance: Well nourished well developed, in no apparent distress.  Eyes: conjunctiva no swelling or erythema ENT/Mouth: No hoarseness, No cough for duration of visit.  Neck: Supple  Respiratory: Respiratory effort  normal, normal rate, no retractions or distress.   Cardio: Appears well-perfused, noncyanotic Musculoskeletal: no obvious deformity Skin: visible skin without rashes, ecchymosis, erythema Neuro: Awake and oriented X 3,  Psych:  normal affect, Insight and Judgment appropriate.    Assessment/Plan: 1. Adjustment disorder with mixed anxiety and  depressed mood - FLUoxetine (PROZAC) 40 MG capsule; Take 1 capsule (40 mg total) by mouth daily.  Dispense: 90 capsule; Refill: 1 - hydrOXYzine (ATARAX) 50 MG tablet; Take 50 mg by mouth once as needed for anxiety.  Dispense: 90 tablet; Refill: 0  2. Slow transit constipation -resolved with dietary changes   3. Encounter for birth control pills maintenance  -refill sent    BH screenings:  PHQ-SADS Last 3 Score only 11/12/2021 08/19/2021 08/25/2020  PHQ-15 Score 3 4 2   Total GAD-7 Score 2 0 0  PHQ Adolescent Score 1 2 1    Screens discussed with patient and parent and adjustments to plan made accordingly.   I discussed the assessment and treatment plan with the patient and/or parent/guardian.  They were provided an opportunity to ask questions and all were answered.  They agreed with the plan and demonstrated an understanding of the instructions. They were advised to call back or seek an in-person evaluation in the emergency room if the symptoms worsen or if the condition fails to improve as anticipated.   Follow-up:   3 months or sooner. Will send ESA letter.    , NP    CC: , MD, Georges Mouse, MD

## 2021-11-13 ENCOUNTER — Encounter: Payer: Self-pay | Admitting: Family

## 2021-11-13 ENCOUNTER — Other Ambulatory Visit: Payer: Self-pay | Admitting: Pediatrics

## 2021-11-19 ENCOUNTER — Telehealth: Payer: BC Managed Care – PPO | Admitting: Family

## 2021-12-11 ENCOUNTER — Other Ambulatory Visit: Payer: Self-pay | Admitting: Family

## 2022-02-11 ENCOUNTER — Telehealth (INDEPENDENT_AMBULATORY_CARE_PROVIDER_SITE_OTHER): Payer: BC Managed Care – PPO | Admitting: Family

## 2022-02-11 ENCOUNTER — Encounter: Payer: Self-pay | Admitting: Family

## 2022-02-11 DIAGNOSIS — N926 Irregular menstruation, unspecified: Secondary | ICD-10-CM | POA: Diagnosis not present

## 2022-02-11 DIAGNOSIS — F4323 Adjustment disorder with mixed anxiety and depressed mood: Secondary | ICD-10-CM | POA: Diagnosis not present

## 2022-02-11 NOTE — Progress Notes (Signed)
THIS RECORD MAY CONTAIN CONFIDENTIAL INFORMATION THAT SHOULD NOT BE RELEASED WITHOUT REVIEW OF THE SERVICE PROVIDER. ? ?Virtual Follow-Up Visit via Video Note ? ?I connected with Melody [name]  on 02/11/22 at  2:30 PM EDT by a video enabled telemedicine application and verified that I am speaking with the correct person using two identifiers.   ?Patient/parent location: apartment, [place] ?Provider location: remote [place], [place]  ?  ?I discussed the limitations of evaluation and management by telemedicine and the availability of in person appointments.  I discussed that the purpose of this telehealth visit is to provide medical care while limiting exposure to the novel coronavirus.  The patient expressed understanding and agreed to proceed. ?  ?Melody [name] is a 21 y.o. female referred by Chrys Racer, MD here today for follow-up of adjustment disorder with mixed anxiety and depressed mood. ? ? History was provided by the patient. ? ?Supervising Physician: Dr. Delorse Lek ? ?Plan from Last Visit:   ?1. Adjustment disorder with mixed anxiety and depressed mood ?- FLUoxetine (PROZAC) 40 MG capsule; Take 1 capsule (40 mg total) by mouth daily.  Dispense: 90 capsule; Refill: 1 ?- hydrOXYzine (ATARAX) 50 MG tablet; Take 50 mg by mouth once as needed for anxiety.   ?-Dispense: 90 tablet; Refill: 0 ?  ?2. Slow transit constipation ?-resolved with dietary changes  ?  ?3. Encounter for birth control pills maintenance  ?-refill sent  ?  ? ? ?Chief Complaint: ?Adjustment disorder with mixed anxiety and depressed mood ? 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 ?No Known Allergies ?Outpatient Medications Prior to Visit  ?Medication Sig Dispense Refill  ? FLUoxetine (PROZAC) 40 MG capsule TAKE 1 CAPSULE (40 MG TOTAL) BY MOUTH DAILY. 90 capsule 2  ? HEATHER 0.35 MG tablet TAKE 1 TABLET DAILY 84 tablet 3  ? hydrOXYzine (ATARAX) 50 MG tablet Take 50 mg by mouth once as needed for anxiety. 90 tablet 0  ? ibuprofen (ADVIL,MOTRIN) 200 MG tablet Take 200 mg by mouth every 6 (six) hours as needed.    ? Specialty Vitamins Products (MAGNESIUM, AMINO ACID CHELATE,) 133 MG tablet Take by mouth.    ? ?No facility-administered medications prior to visit.  ?  ? ?Patient Active Problem List  ? Diagnosis Date Noted  ? Family history of breast cancer 08/04/2019  ? Sleep disturbance 05/19/2018  ? Headache in pediatric patient 05/19/2018  ? Vaginal discharge 08/27/2017  ? Disordered eating 01/06/2017  ? Adjustment disorder with mixed anxiety and depressed mood 01/06/2017  ? ?The following portions of the patient's history were reviewed and updated as appropriate: allergies, current medications, past family history, past medical history, past social history, past surgical history, and problem list. ? ?Visual Observations/Objective:  ? ?General Appearance: Well nourished well developed, in no apparent distress.  ?Eyes: conjunctiva no swelling or erythema ?ENT/Mouth: No hoarseness, No cough for duration of visit.  ?Neck: Supple  ?Respiratory: Respiratory effort normal, normal rate, no retractions or  distress.   ?Cardio: Appears well-perfused, noncyanotic ?Musculoskeletal: no obvious deformity ?Skin: visible skin without rashes, ecchymosis, erythema ?Neuro: Awake and oriented X 3,  ?Psych:  normal affect, Insight and Judgment appropriate.  ? ? ?Assessment/Plan: ?1. Adjustment disorder with mixed anxiety and depressed mood ?2. Irregular periods ? ?-continue with fluoxetine 40 mg   ?-hydroxyzine 50 mg for sleep or anxiety  ?-continue with birth control pills  ?-advised to take another pg test with early AM urine  ? ? ?I discussed the assessment and treatment plan with the patient and/or parent/guardian.  ?They were provided an opportunity to ask questions and all were answered.  ?They agreed with the plan and demonstrated an understanding of the instructions. ?They were advised to call back or seek an in-person evaluation in the emergency room if the symptoms worsen or if the condition fails to improve as anticipated. ? ? ?Follow-up:   pending return to [place], otherwise 3 months  ? ?Georges Mouse, NP  ? ? ?CC: Moffitt, Gwendalyn Ege, MD, Chrys Racer, MD ? ? ? ?

## 2022-02-25 ENCOUNTER — Encounter: Payer: Self-pay | Admitting: Family

## 2022-06-24 ENCOUNTER — Emergency Department
Admission: EM | Admit: 2022-06-24 | Discharge: 2022-06-24 | Disposition: A | Discharge status: Home / Self Care | Payer: BC Managed Care – PPO | Attending: Emergency Medicine | Admitting: Emergency Medicine

## 2022-06-24 DIAGNOSIS — W5501XA Bitten by cat, initial encounter: Secondary | ICD-10-CM | POA: Diagnosis not present

## 2022-06-24 DIAGNOSIS — S61250A Open bite of right index finger without damage to nail, initial encounter: Secondary | ICD-10-CM | POA: Diagnosis not present

## 2022-06-24 DIAGNOSIS — Z23 Encounter for immunization: Secondary | ICD-10-CM | POA: Diagnosis not present

## 2022-06-24 DIAGNOSIS — S65500A Unspecified injury of blood vessel of right index finger, initial encounter: Secondary | ICD-10-CM | POA: Diagnosis present

## 2022-06-24 MED ORDER — AMOXICILLIN-POT CLAVULANATE 875-125 MG PO TABS: 1.0000 | ORAL_TABLET | Freq: Two times a day (BID) | ORAL | 0 refills | Status: DC

## 2022-06-24 MED ORDER — TETANUS-DIPHTH-ACELL PERTUSSIS 5-2.5-18.5 LF-MCG/0.5 IM SUSY
0.5000 mL | PREFILLED_SYRINGE | Freq: Once | INTRAMUSCULAR | Status: AC
  Administered 2022-06-24: 0.5 mL via INTRAMUSCULAR
  Filled 2022-06-24: qty 0.5

## 2022-06-24 NOTE — ED Notes (Signed)
Pt eval and d/c by provider in flex

## 2022-06-24 NOTE — ED Triage Notes (Signed)
Pt presents to ED via POV with c/o cat bite. Pt was bitten by stray cat on right pointer finger tonight. No bleeding at this time

## 2022-06-24 NOTE — Discharge Instructions (Addendum)
Follow up with your regular doctor as needed Call animal control to watch the cat, if it shows signs of rabies/illness you will need to start rabies vaccines Take the antibiotic as prescribed

## 2022-06-24 NOTE — ED Provider Notes (Signed)
   [facility] Provider Note    Event Date/Time   First MD Initiated Contact with Patient 06/24/22 1924     (approximate)   History   Animal Bite   HPI Melody [name] is a 21 y.o. female with no significant pmh, presents with cat bite to finger, has possession of the cat, unsure of rabies status, happened today, unsure of last tdap       Physical Exam   Triage Vital Signs: ED Triage Vitals  Enc Vitals Group     BP 06/24/22 1922 108/82     Pulse Rate 06/24/22 1922 65     Resp 06/24/22 1922 18     Temp 06/24/22 1922 99.7 F (37.6 C)     Temp Source 06/24/22 1922 Oral     SpO2 06/24/22 1922 100 %     Weight --      Height --      Head Circumference --      Peak Flow --      Pain Score 06/24/22 1923 0     Pain Loc --      Pain Edu? --      Excl. in [place]? --     Most recent vital signs: Vitals:   06/24/22 1922  BP: 108/82  Pulse: 65  Resp: 18  Temp: 99.7 F (37.6 C)  SpO2: 100%     General: Awake, no distress.   CV:  Good peripheral perfusion. regular rate and  rhythm Resp:  Normal effort.  Abd:  No distention.   Other:      ED Results / Procedures / Treatments   Labs (all labs ordered are listed, but only abnormal results are displayed) Labs Reviewed - No data to display   EKG     RADIOLOGY     PROCEDURES:   Procedures   MEDICATIONS ORDERED IN ED: Medications  Tdap (BOOSTRIX) injection 0.5 mL (has no administration in time range)     IMPRESSION / MDM / ASSESSMENT AND PLAN / ED COURSE  I reviewed the triage vital signs and the nursing notes.                              Differential diagnosis includes, but is not limited to, cat bite abrasion laceration  Patient's presentation is most consistent with acute, uncomplicated illness.   Explained the findings to the patient, will do tdap and start on augmentin.  Patient is in agreement to to have the cat monitored for signs of rabies prior to starting  vaccines.  Strict instructions to return if any sign of rabies      FINAL CLINICAL IMPRESSION(S) / ED DIAGNOSES   Final diagnoses:  Cat bite, initial encounter     Rx / DC Orders   ED Discharge Orders          Ordered    amoxicillin-clavulanate (AUGMENTIN) 875-125 MG tablet  2 times daily        06/24/22 1926             Note:  This document was prepared using Dragon voice recognition software and may include unintentional dictation errors.    Versie Starks, PA-C 06/24/22 1931    Delman Kitten, MD 06/24/22 [phone]

## 2022-10-18 ENCOUNTER — Encounter: Payer: Self-pay | Admitting: Family

## 2022-10-19 ENCOUNTER — Other Ambulatory Visit: Payer: Self-pay | Admitting: Family

## 2022-10-19 MED ORDER — HEATHER 0.35 MG PO TABS: 1.0000 | ORAL_TABLET | Freq: Every day | ORAL | 3 refills | Status: DC

## 2022-10-23 ENCOUNTER — Other Ambulatory Visit: Payer: Self-pay | Admitting: Family

## 2022-10-23 DIAGNOSIS — F4323 Adjustment disorder with mixed anxiety and depressed mood: Secondary | ICD-10-CM

## 2022-11-20 ENCOUNTER — Other Ambulatory Visit: Payer: Self-pay | Admitting: Family

## 2022-11-20 DIAGNOSIS — F4323 Adjustment disorder with mixed anxiety and depressed mood: Secondary | ICD-10-CM

## 2023-01-08 ENCOUNTER — Encounter: Payer: Self-pay | Admitting: Family

## 2023-01-12 ENCOUNTER — Telehealth (INDEPENDENT_AMBULATORY_CARE_PROVIDER_SITE_OTHER): Payer: Self-pay | Admitting: Family

## 2023-01-12 ENCOUNTER — Encounter: Payer: Self-pay | Admitting: Family

## 2023-01-12 DIAGNOSIS — N926 Irregular menstruation, unspecified: Secondary | ICD-10-CM

## 2023-01-12 DIAGNOSIS — F4323 Adjustment disorder with mixed anxiety and depressed mood: Secondary | ICD-10-CM

## 2023-01-12 NOTE — Progress Notes (Signed)
THIS RECORD MAY CONTAIN CONFIDENTIAL INFORMATION THAT SHOULD NOT BE RELEASED WITHOUT REVIEW OF THE SERVICE PROVIDER.  Virtual Follow-Up Visit via Video Note  I connected with Melody [name]  on 01/12/23 at  3:30 PM EDT by a video enabled telemedicine application and verified that I am speaking with the correct person using two identifiers.   Patient/parent location: dorm, [place]  Provider location: [facility] office    I discussed the limitations of evaluation and management by telemedicine and the availability of in person appointments.  I discussed that the purpose of this telehealth visit is to provide medical care while limiting exposure to the novel coronavirus.  The patient expressed understanding and agreed to proceed.   Melody [name] is a 22 y.o. female referred by Chrys Racer, MD here today for follow-up of adjustment disorder with mixed anxiety and depressed mood, irregular periods.   History was provided by the patient.  Supervising Physician: Dr. Theadore Nan   Plan from Last Visit:   1. Adjustment disorder with mixed anxiety and depressed mood 2. Irregular periods   -continue with fluoxetine 40 mg  -hydroxyzine 50 mg for sleep or anxiety  -continue with birth control pills  -advised to take another pg test with early AM urine     Chief Complaint: Needs ESA letter for apartment; has one for current dorm living arrangement   History of Present Illness:  -has kept her cat with her in her dorm at [employer] and this has been a great comfort.  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 14 tablet 0   FLUoxetine (PROZAC) 40 MG capsule TAKE 1 CAPSULE (40 MG TOTAL) BY MOUTH DAILY. 90 capsule 3   HEATHER 0.35 MG tablet Take 1 tablet (0.35 mg total) by mouth daily. 84 tablet 3   hydrOXYzine (ATARAX) 50 MG tablet Take 50 mg by mouth once as needed for anxiety. 90 tablet 0   ibuprofen (ADVIL,MOTRIN) 200 MG tablet Take 200 mg by mouth every 6 (six) hours as needed.     Specialty Vitamins Products (MAGNESIUM, AMINO ACID CHELATE,) 133 MG tablet Take by mouth.     No facility-administered medications prior to visit.     Patient Active Problem List   Diagnosis Date Noted   Family history of breast cancer 08/04/2019   Sleep disturbance 05/19/2018   Headache in pediatric patient 05/19/2018   Vaginal discharge 08/27/2017   Disordered eating 01/06/2017   Adjustment disorder with mixed anxiety and depressed mood 01/06/2017    The following portions of the patient's history were reviewed and updated as appropriate: allergies, current medications, past family history, past medical history, past social history, past surgical history, and problem list.  Visual Observations/Objective:   General Appearance: Well nourished well developed, in no apparent distress.  Eyes: conjunctiva no swelling or erythema ENT/Mouth: No hoarseness, No cough for duration of visit.  Neck: Supple  Respiratory: Respiratory effort normal, normal rate, no  retractions or distress.   Cardio: Appears well-perfused, noncyanotic Musculoskeletal: no obvious deformity Skin: visible skin without rashes, ecchymosis, erythema Neuro: Awake and oriented X 3,  Psych:  normal affect, Insight and Judgment appropriate.    Assessment/Plan: 1. Adjustment disorder with mixed anxiety and depressed mood -continue with fluoxetine 40 mg  -return in  July for in person follow up; PHQSADS at that time  -ESA letter through My Chart.  2. Irregular periods -advised to stop pills x one week (use backup method or abstain) and restart pills to attempt reset -return precautions reviewed    I discussed the assessment and treatment plan with the patient and/or parent/guardian.  They were provided an opportunity to ask questions and all were answered.  They agreed with the plan and demonstrated an understanding of the instructions. They were advised to call back or seek an in-person evaluation in the emergency room if the symptoms worsen or if the condition fails to improve as anticipated.   Follow-up:   3 months in person    Georges Mouse, NP    CC: Chrys Racer, MD, Chrys Racer, MD

## 2023-01-18 ENCOUNTER — Ambulatory Visit: Payer: Self-pay

## 2023-01-18 ENCOUNTER — Encounter: Payer: Self-pay | Admitting: Family

## 2023-01-18 NOTE — Progress Notes (Signed)
CASE MANAGEMENT VISIT  Total time: 15 minutes  Type of Service:CASE MANAGEMENT Interpretor:No. Interpretor Name and Language: na   Summary of Today's Visit: See mychart thread. ESA letter created, reviewed by Neysa Bonito and made available to Vercie via mychart.    Plan for Next Visit:     Kathee Polite [facility] Coordinator

## 2023-03-01 ENCOUNTER — Other Ambulatory Visit: Payer: Self-pay | Admitting: Family

## 2023-03-01 DIAGNOSIS — F4323 Adjustment disorder with mixed anxiety and depressed mood: Secondary | ICD-10-CM

## 2023-03-19 ENCOUNTER — Encounter: Payer: Self-pay | Admitting: Family

## 2023-04-08 ENCOUNTER — Encounter: Payer: Self-pay | Admitting: Family

## 2023-04-08 ENCOUNTER — Other Ambulatory Visit: Payer: Self-pay | Admitting: Family

## 2023-04-08 MED ORDER — HEATHER 0.35 MG PO TABS: 1.0000 | ORAL_TABLET | Freq: Every day | ORAL | 3 refills | Status: DC

## 2023-04-12 ENCOUNTER — Other Ambulatory Visit (HOSPITAL_COMMUNITY)
Admission: RE | Admit: 2023-04-12 | Discharge: 2023-04-12 | Disposition: A | Discharge status: Home / Self Care | Payer: 59 | Source: Ambulatory Visit | Attending: Family | Admitting: Family

## 2023-04-12 ENCOUNTER — Encounter: Payer: Self-pay | Admitting: Family

## 2023-04-12 ENCOUNTER — Ambulatory Visit: Payer: 59 | Admitting: Family

## 2023-04-12 VITALS — BP 105/67 | HR 64 | Ht 62.13 in | Wt 108.8 lb

## 2023-04-12 DIAGNOSIS — F4323 Adjustment disorder with mixed anxiety and depressed mood: Secondary | ICD-10-CM

## 2023-04-12 DIAGNOSIS — Z113 Encounter for screening for infections with a predominantly sexual mode of transmission: Secondary | ICD-10-CM | POA: Insufficient documentation

## 2023-04-12 DIAGNOSIS — N921 Excessive and frequent menstruation with irregular cycle: Secondary | ICD-10-CM

## 2023-04-12 DIAGNOSIS — Z3202 Encounter for pregnancy test, result negative: Secondary | ICD-10-CM

## 2023-04-12 DIAGNOSIS — Z7187 Encounter for pediatric-to-adult transition counseling: Secondary | ICD-10-CM

## 2023-04-12 LAB — POCT URINE PREGNANCY: Preg Test, Ur: NEGATIVE

## 2023-04-12 NOTE — Progress Notes (Signed)
History was provided by the patient.  Melody [name] is a 22 y.o. female who is here for adjustment disorder with mixed anxiety and depressed mood, irregular periods.   PCP confirmed? Yes.    Chrys Racer, MD  Plan from last visit:  1. Adjustment disorder with mixed anxiety and depressed mood -continue with fluoxetine 40 mg  -return in July for in person follow up; PHQSADS at that time  -ESA letter through My Chart.  2. 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  -needs referral to adult provider for ongoing care   Patient Active Problem List   Diagnosis Date Noted   Family history of breast cancer 08/04/2019   Sleep disturbance 05/19/2018   Headache in pediatric patient 05/19/2018   Vaginal discharge 08/27/2017   Disordered eating 01/06/2017   Adjustment disorder with mixed anxiety and depressed mood 01/06/2017    Current Outpatient Medications on File Prior to Visit  Medication Sig Dispense Refill   FLUoxetine (PROZAC) 40 MG capsule TAKE 1 CAPSULE (40 MG TOTAL) BY MOUTH DAILY. 90 capsule 4   HEATHER 0.35 MG tablet Take 1 tablet (0.35 mg total) by mouth daily. 84 tablet 3   hydrOXYzine (ATARAX) 50 MG tablet Take 50 mg by mouth once as needed for anxiety. 90 tablet 0   ibuprofen (ADVIL,MOTRIN) 200 MG tablet Take 200 mg by mouth every 6 (six) hours as needed.     No current facility-administered medications on file prior to visit.    No Known Allergies  Physical Exam:    Vitals:   04/12/23 1633  BP: 105/67  Pulse: 64  Weight: 108 lb 12.8 oz (49.4 kg)  Height: 5' 2.13" (1.578 m)   Wt  Readings from Last 3 Encounters:  04/12/23 108 lb 12.8 oz (49.4 kg)  08/19/21 114 lb 9.6 oz (52 kg)  08/20/20 107 lb (48.5 kg) (11%, Z= -1.20)*   * Growth percentiles are based on CDC (Girls, 2-20 Years) data.     Growth %ile SmartLinks can only be used for patients less than 45 years old. No LMP recorded.  Physical Exam Constitutional:      General: She is not in acute distress.    Appearance: She is well-developed.  HENT:     Head: Normocephalic and atraumatic.  Eyes:     General: No scleral icterus.    Pupils: Pupils are equal, round, and reactive to light.  Neck:     Thyroid: No thyromegaly.  Cardiovascular:     Rate and Rhythm: Normal rate and regular rhythm.     Heart sounds: Normal heart sounds. No murmur heard. Pulmonary:     Effort: Pulmonary effort is normal.     Breath sounds: Normal breath sounds.  Musculoskeletal:        General: Normal range of motion.     Cervical back: Normal range of motion and neck supple.  Lymphadenopathy:     Cervical: No cervical adenopathy.  Skin:    General: Skin is warm and dry.     Findings: No rash.  Neurological:     Mental Status: She is  alert and oriented to person, place, and time.     Cranial Nerves: No cranial nerve deficit.     Motor: No tremor.  Psychiatric:        Attention and Perception: Attention normal.        Mood and Affect: Mood normal.        Speech: Speech normal.        Behavior: Behavior normal.        Thought Content: Thought content normal.        Judgment: Judgment normal.      Assessment/Plan:  -breakthrough bleeding with progesterone-only birth control pill, which is common side effect of time sensitivity with progesterone-only pills. Will screen for infection, reassurance given. Doing well with fluoxetine 40 mg. Continue with dose, no change. Will refer to adult care for ongoing management due to aging out of practice.   1. Adjustment disorder with mixed anxiety and depressed mood 2.  Breakthrough bleeding on birth control pills  3. Routine screening for STI (sexually transmitted infection) - POCT urine pregnancy - Urine cytology ancillary only

## 2023-04-14 LAB — URINE CYTOLOGY ANCILLARY ONLY
Chlamydia: NEGATIVE
Comment: NEGATIVE
Comment: NEGATIVE
Comment: NORMAL
Neisseria Gonorrhea: NEGATIVE
Trichomonas: NEGATIVE

## 2023-06-09 ENCOUNTER — Telehealth: Payer: Self-pay

## 2023-06-09 ENCOUNTER — Encounter: Payer: Self-pay | Admitting: Family

## 2023-06-09 NOTE — Telephone Encounter (Signed)
My-chart message sent to Melody [name] regarding transition to adult care due to age. Provided link to [facility] offices. Also sent transition letter via mychart. Now that she is 22yo, we are unable to see her at our clinic.

## 2023-08-04 ENCOUNTER — Other Ambulatory Visit: Payer: Self-pay

## 2023-08-04 ENCOUNTER — Encounter: Payer: Self-pay | Admitting: Medical

## 2023-08-04 ENCOUNTER — Ambulatory Visit: Payer: Self-pay | Admitting: Medical

## 2023-08-04 VITALS — BP 121/81 | HR 74 | Temp 97.6°F

## 2023-08-04 DIAGNOSIS — Z7184 Encounter for health counseling related to travel: Secondary | ICD-10-CM | POA: Diagnosis not present

## 2023-08-04 MED ORDER — TYPHIM VI 25 MCG/0.5ML IM SOLN: 0.5000 mL | Freq: Once | INTRAMUSCULAR | 0 refills | Status: AC

## 2023-08-04 MED ORDER — ACETAZOLAMIDE 125 MG PO TABS: 125.0000 mg | ORAL_TABLET | Freq: Two times a day (BID) | ORAL | 0 refills | Status: DC

## 2023-08-04 NOTE — Progress Notes (Signed)
[facility] [address] [place], [place] [address] Phone: [phone] Fax: [phone]   Office Visit Note  Patient Name: Melody [name]  Date of UUVOZ:[id number]  Med Rec number [id number]  Date of Service: 08/04/2023  Allergies: Patient has no known allergies.  Chief Complaint  Patient presents with   Travel Consult     HPI 22 y.o. college student presents to discuss medications for travel.  Will be going on [employer]-sponsored trip to [place] over Thanksgiving break, hiking [place]. Would like to get medicine for prevention of altitude sickness. Also interested in Typhoid vaccine.   Leaves [place] on Thursday 11/21, spends a couple days acclimating once arrived in [place], then hiking 6-7 days, finishing hike on 11/30 she believes.  Had tetanus booster in 2023. Has had Hep A and Hep B series in childhood and all other standard vaccines. Had recent flu and COVID-19 vaccines.    Current Medication:  Outpatient Encounter Medications as of 08/04/2023  Medication Sig   FLUoxetine (PROZAC) 40 MG capsule TAKE 1 CAPSULE (40 MG TOTAL) BY MOUTH DAILY.   HEATHER 0.35 MG tablet Take 1 tablet (0.35 mg total) by mouth daily.   hydrOXYzine (ATARAX) 50 MG tablet Take 50 mg by mouth once as needed for anxiety.   [DISCONTINUED] ibuprofen (ADVIL,MOTRIN) 200 MG tablet Take 200 mg by mouth every 6 (six) hours as needed. (Patient not taking: Reported on 08/04/2023)   No facility-administered encounter medications on file as of 08/04/2023.      Medical History: Past Medical History:  Diagnosis Date   Anxiety    Phreesia 07/03/2020   Hyperlipidemia    Medical history non-contributory      Vital Signs: BP 121/81   Pulse 74   Temp 97.6 F (36.4 C) (Tympanic)   SpO2 98%    Review of Systems  Constitutional: Negative.     Physical Exam Vitals reviewed.  Constitutional:      General: She is not in acute distress.    Appearance: She is not ill-appearing.  Neurological:      Mental Status: She is alert.     Assessment/Plan: 1. Travel advice encounter Discussed possible interaction of Acetazolamide and Fluoxetine, increased risk of hyponatremia. Patient already planning to consume liquid IV packets. Advised to also consider bringing salt tablets to have if needed. Discussed sx of hyponatremia. Patient encouraged to have Typhoid vaccine administered at least 2 weeks before travel.  - acetaZOLAMIDE (DIAMOX) 125 MG tablet; Take 1 tablet (125 mg total) by mouth 2 (two) times daily for 10 days. Start taking the day you leave the [place], continue while ascending elevation. Stop once you begin to descend.  Dispense: 20 tablet; Refill: 0 - typhoid polysaccharide (TYPHIM VI) 25 MCG/0.5ML injection; Inject 0.5 mLs into the muscle once for 1 dose.  Dispense: 0.5 mL; Refill: 0     General Counseling: [name] verbalizes understanding of the findings of todays visit and agrees with plan of treatment. she has been encouraged to call the office with any questions or concerns that should arise related to todays visit.    Time spent:20 Minutes    Jonathon Resides PA-C [facility] 08/04/2023 1:42 PM

## 2023-08-04 NOTE — Patient Instructions (Signed)
-  Have Typhoid vaccine administered at least 2 weeks before travel. -In addition to liquid IV packets, consider bringing salt tablets to have if needed.

## 2023-10-01 ENCOUNTER — Encounter: Payer: Self-pay | Admitting: Nurse Practitioner

## 2023-10-01 ENCOUNTER — Ambulatory Visit: Payer: 59 | Admitting: Nurse Practitioner

## 2023-10-01 VITALS — BP 100/64 | HR 64 | Temp 98.3°F | Ht 62.0 in | Wt 110.6 lb

## 2023-10-01 DIAGNOSIS — Z0001 Encounter for general adult medical examination with abnormal findings: Secondary | ICD-10-CM | POA: Diagnosis not present

## 2023-10-01 DIAGNOSIS — Z1329 Encounter for screening for other suspected endocrine disorder: Secondary | ICD-10-CM

## 2023-10-01 DIAGNOSIS — R519 Headache, unspecified: Secondary | ICD-10-CM

## 2023-10-01 DIAGNOSIS — Z13220 Encounter for screening for lipoid disorders: Secondary | ICD-10-CM

## 2023-10-01 DIAGNOSIS — F4323 Adjustment disorder with mixed anxiety and depressed mood: Secondary | ICD-10-CM

## 2023-10-01 DIAGNOSIS — N926 Irregular menstruation, unspecified: Secondary | ICD-10-CM | POA: Diagnosis not present

## 2023-10-01 DIAGNOSIS — Z124 Encounter for screening for malignant neoplasm of cervix: Secondary | ICD-10-CM

## 2023-10-01 NOTE — Assessment & Plan Note (Signed)
 Stable on Prozac 40mg  daily with no new concerns or changes in mood reported. We will continue Prozac 40mg  daily. PHQ- 2 and GAD- 3 today. Encouraged to contact if worsening symptoms, unusual behavior changes or suicidal thoughts occur.

## 2023-10-01 NOTE — Assessment & Plan Note (Signed)
 Reports irregular periods despite continuous use of birth control Teacher, English as a foreign language) without other associated symptoms. We will refer them to OB/GYN for further evaluation and a Pap smear and continue Heather birth control as currently prescribed.

## 2023-10-01 NOTE — Progress Notes (Signed)
 Leron Glance, NP-C Phone: [phone]  Melody [name] is a 23 y.o. female who presents today to establish care.    Discussed the use of AI scribe software for clinical note transcription with the patient, who gave verbal consent to proceed.  History of Present Illness   The patient, currently on Prozac  40mg  daily and birth control, reports a stable mood with significant improvement in anxiety. They have been experiencing irregular menstrual cycles, even while on birth control, with periods varying in duration and timing. The patient describes their menstrual flow as heavy for two days every other month. They have previously tried Nexplanon  for birth control, but discontinued due to frequent bleeding. Despite continuous use of their current birth control, they still experience monthly periods.  The patient also reports occasional headaches, occurring approximately once a week or every two weeks, which are managed with over-the-counter medication. The patient denies any chest pain, shortness of breath, abdominal pain, constipation, diarrhea, burning sensation during urination, abnormal discharge, painful sex, dizziness, trouble swallowing, coughing, skin changes or rashes, and swelling in the legs.  The patient is sexually active and has a family history of breast cancer. They are due for a Pap smear and have not had one previously. 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 They report a diet that could include more vegetables and try to cook as much as possible, despite a busy schedule. They have received their flu shot and COVID vaccines, and are up-to-date with their tetanus shot. They do not smoke or use drugs, and consume alcohol occasionally, typically on weekends. They regularly see a dentist and an eye doctor.      Active Ambulatory Problems    Diagnosis Date Noted   Disordered eating 01/06/2017   Adjustment disorder with mixed anxiety and depressed mood  01/06/2017   Vaginal discharge 08/27/2017   Sleep disturbance 05/19/2018   Frequent headaches 05/19/2018   Family history of breast cancer 08/04/2019   Encounter for routine adult medical exam with abnormal findings 10/01/2023   Irregular periods/menstrual cycles 10/01/2023   Resolved Ambulatory Problems    Diagnosis Date Noted   Back pain 05/15/2016   Secondary amenorrhea 01/06/2017   Mild malnutrition (HCC) 01/06/2017   Nexplanon  in place 01/29/2020   Past Medical History:  Diagnosis Date   Anxiety    Hyperlipidemia    Medical history non-contributory     Family History  Problem Relation Age of Onset   Hyperlipidemia Mother    Depression Mother    Anxiety disorder Mother    Cancer Maternal Aunt    Cancer Maternal Uncle    Diabetes Maternal Grandmother    COPD Maternal Grandmother    COPD Paternal Grandfather    Cancer Other    Depression Sister    Anxiety disorder Sister    ADD / ADHD Sister    Anxiety disorder Maternal Aunt     Social History   Socioeconomic History   Marital status: Single    Spouse name: Not on file   Number of children: Not on file   Years of education: Not on file   Highest education level: 12th grade  Occupational History   Not on file  Tobacco Use   Smoking status: Never   Smokeless tobacco: Never  Substance and Sexual Activity   Alcohol use: No   Drug use: Never   Sexual activity: Not on file  Other Topics Concern   Not on file  Social History Narrative  Not on file   Social Drivers of Health   Financial Resource Strain: Low Risk  (09/30/2023)   Overall Financial Resource Strain (CARDIA)    Difficulty of Paying Living Expenses: Not hard at all  Food Insecurity: No Food Insecurity (09/30/2023)   Hunger Vital Sign    Worried About Running Out of Food in the Last Year: Never true    Ran Out of Food in the Last Year: Never true  Transportation Needs: No Transportation Needs (09/30/2023)   PRAPARE - Scientist, Research (physical Sciences) (Medical): No    Lack of Transportation (Non-Medical): No  Physical Activity: Insufficiently Active (09/30/2023)   Exercise Vital Sign    Days of Exercise per Week: 7 days    Minutes of Exercise per Session: 20 min  Stress: No Stress Concern Present (09/30/2023)   [employer] of Occupational Health - Occupational Stress Questionnaire    Feeling of Stress : Only a little  Social Connections: Moderately Isolated (09/30/2023)   Social Connection and Isolation Panel [NHANES]    Frequency of Communication with Friends and Family: Once a week    Frequency of Social Gatherings with Friends and Family: More than three times a week    Attends Religious Services: Never    Database Administrator or Organizations: Yes    Attends Engineer, Structural: More than 4 times per year    Marital Status: Never married  Intimate Partner Violence: Unknown (01/02/2022)   Received from [employer], [facility]   HITS    Physically Hurt: Not on file    Insult or Talk Down To: Not on file    Threaten Physical Harm: Not on file    Scream or Curse: Not on file    ROS  General:  Negative for unexplained weight loss, fever Skin: Negative for new or changing mole, sore that won't heal HEENT: Negative for trouble hearing, trouble seeing, ringing in ears, mouth sores, hoarseness, change in voice, dysphagia. CV:  Negative for chest pain, dyspnea, edema, palpitations Resp: Negative for cough, dyspnea, hemoptysis GI: Negative for nausea, vomiting, diarrhea, constipation, abdominal pain, melena, hematochezia. GU: Negative for dysuria, incontinence, urinary hesitance, hematuria, vaginal or penile discharge, polyuria, sexual difficulty, lumps in testicle or breasts MSK: Negative for muscle cramps or aches, joint pain or swelling Neuro: Negative for weakness, numbness, dizziness, passing out/fainting Psych: Negative for depression, anxiety, memory problems  Objective  Physical Exam Vitals:    10/01/23 1403  BP: 100/64  Pulse: 64  Temp: 98.3 F (36.8 C)  SpO2: 99%    BP Readings from Last 3 Encounters:  10/01/23 100/64  08/04/23 121/81  04/12/23 105/67   Wt Readings from Last 3 Encounters:  10/01/23 110 lb 9.6 oz (50.2 kg)  04/12/23 108 lb 12.8 oz (49.4 kg)  08/19/21 114 lb 9.6 oz (52 kg)    Physical Exam Constitutional:      General: She is not in acute distress.    Appearance: Normal appearance.  HENT:     Head: Normocephalic.     Right Ear: Tympanic membrane normal.     Left Ear: Tympanic membrane normal.     Nose: Nose normal.     Mouth/Throat:     Mouth: Mucous membranes are moist.     Pharynx: Oropharynx is clear.  Eyes:     Conjunctiva/sclera: Conjunctivae normal.     Pupils: Pupils are equal, round, and reactive to light.  Neck:     Thyroid : No thyromegaly.  Cardiovascular:     Rate and Rhythm: Normal rate and regular rhythm.     Heart sounds: Normal heart sounds.  Pulmonary:     Effort: Pulmonary effort is normal.     Breath sounds: Normal breath sounds.  Abdominal:     General: Abdomen is flat. Bowel sounds are normal.     Palpations: Abdomen is soft. There is no mass.     Tenderness: There is no abdominal tenderness.  Musculoskeletal:        General: Normal range of motion.  Lymphadenopathy:     Cervical: No cervical adenopathy.  Skin:    General: Skin is warm and dry.     Findings: No rash.  Neurological:     General: No focal deficit present.     Mental Status: She is alert.  Psychiatric:        Mood and Affect: Mood normal.        Behavior: Behavior normal.    Assessment/Plan:   Encounter for routine adult medical exam with abnormal findings Assessment & Plan: Physical exam complete. We will order routine lab work as outlined and contact patient with the results. Pap smear- due, referral placed to Ob-Gyn. Her flu vaccines and tetanus vaccine are up to date. She has received 4 doses of the COVID vaccine. Recommended follow  ups with dentist and eye doctor for regular exams. Encouraged to continue healthy diet and exercise. Return to care in one year, sooner as needed.   Orders: -     CBC with Differential/Platelet -     Comprehensive metabolic panel  Adjustment disorder with mixed anxiety and depressed mood Assessment & Plan: Stable on Prozac  40mg  daily with no new concerns or changes in mood reported. We will continue Prozac  40mg  daily. PHQ- 2 and GAD- 3 today. Encouraged to contact if worsening symptoms, unusual behavior changes or suicidal thoughts occur.   Orders: -     VITAMIN D  25 Hydroxy (Vit-D Deficiency, Fractures)  Irregular periods/menstrual cycles Assessment & Plan: Reports irregular periods despite continuous use of birth control (Heather ) without other associated symptoms. We will refer them to OB/GYN for further evaluation and a Pap smear and continue Heather  birth control as currently prescribed.  Orders: -     Ambulatory referral to Obstetrics / Gynecology  Frequent headaches Assessment & Plan: They report occasional headaches, managed with over-the-counter medication. No change in management needed at this time. Encouraged to contact if occurring more frequently or changing in presentation.    Screening for cervical cancer -     Ambulatory referral to Obstetrics / Gynecology  Thyroid  disorder screen -     TSH  Lipid screening -     Lipid panel   Return in about 1 year (around 09/30/2024) for Annual Exam, sooner as needed.   Leron Glance, NP-C [facility]

## 2023-10-01 NOTE — Assessment & Plan Note (Signed)
 They report occasional headaches, managed with over-the-counter medication. No change in management needed at this time. Encouraged to contact if occurring more frequently or changing in presentation.

## 2023-10-01 NOTE — Assessment & Plan Note (Addendum)
 Physical exam complete. We will order routine lab work as outlined and contact patient with the results. Pap smear- due, referral placed to Ob-Gyn. Her flu vaccines and tetanus vaccine are up to date. She has received 4 doses of the COVID vaccine. Recommended follow ups with dentist and eye doctor for regular exams. Encouraged to continue healthy diet and exercise. Return to care in one year, sooner as needed.

## 2023-10-02 LAB — COMPREHENSIVE METABOLIC PANEL
ALT: 15 [IU]/L (ref 0–32)
AST: 14 [IU]/L (ref 0–40)
Albumin: 4.3 g/dL (ref 4.0–5.0)
Alkaline Phosphatase: 48 [IU]/L (ref 44–121)
BUN/Creatinine Ratio: 20 (ref 9–23)
BUN: 15 mg/dL (ref 6–20)
Bilirubin Total: 0.9 mg/dL (ref 0.0–1.2)
CO2: 23 mmol/L (ref 20–29)
Calcium: 9.3 mg/dL (ref 8.7–10.2)
Chloride: 102 mmol/L (ref 96–106)
Creatinine, Ser: 0.74 mg/dL (ref 0.57–1.00)
Globulin, Total: 3 g/dL (ref 1.5–4.5)
Glucose: 70 mg/dL (ref 70–99)
Potassium: 4.3 mmol/L (ref 3.5–5.2)
Sodium: 139 mmol/L (ref 134–144)
Total Protein: 7.3 g/dL (ref 6.0–8.5)
eGFR: 117 mL/min/{1.73_m2} (ref >59)

## 2023-10-02 LAB — CBC WITH DIFFERENTIAL/PLATELET
Basophils Absolute: 0.1 10*3/uL (ref 0.0–0.2)
Basos: 1 %
EOS (ABSOLUTE): 0.2 10*3/uL (ref 0.0–0.4)
Eos: 3 %
Hematocrit: 43 % (ref 34.0–46.6)
Hemoglobin: 14.8 g/dL (ref 11.1–15.9)
Immature Grans (Abs): 0 10*3/uL (ref 0.0–0.1)
Immature Granulocytes: 0 %
Lymphocytes Absolute: 2.7 10*3/uL (ref 0.7–3.1)
Lymphs: 31 %
MCH: 32.4 pg (ref 26.6–33.0)
MCHC: 34.4 g/dL (ref 31.5–35.7)
MCV: 94 fL (ref 79–97)
Monocytes Absolute: 0.9 10*3/uL (ref 0.1–0.9)
Monocytes: 10 %
Neutrophils Absolute: 4.8 10*3/uL (ref 1.4–7.0)
Neutrophils: 55 %
Platelets: 287 10*3/uL (ref 150–450)
RBC: 4.57 x10E6/uL (ref 3.77–5.28)
RDW: 11.6 % — ABNORMAL LOW (ref 11.7–15.4)
WBC: 8.6 10*3/uL (ref 3.4–10.8)

## 2023-10-02 LAB — TSH: TSH: 0.863 u[IU]/mL (ref 0.450–4.500)

## 2023-10-02 LAB — VITAMIN D 25 HYDROXY (VIT D DEFICIENCY, FRACTURES): Vit D, 25-Hydroxy: 32.9 ng/mL (ref 30.0–100.0)

## 2023-10-02 LAB — LIPID PANEL
Chol/HDL Ratio: 3.8 {ratio} (ref 0.0–4.4)
Cholesterol, Total: 171 mg/dL (ref 100–199)
HDL: 45 mg/dL (ref >39)
LDL Chol Calc (NIH): 110 mg/dL — ABNORMAL HIGH (ref 0–99)
Triglycerides: 88 mg/dL (ref 0–149)
VLDL Cholesterol Cal: 16 mg/dL (ref 5–40)

## 2023-11-01 NOTE — Progress Notes (Signed)
 Gretel App, NP   Chief Complaint  Patient presents with   Menstrual Problem    Cycles are every 3-4 weeks, they can last between 2-5 days x couple of years    HPI:      Ms. Melody [name] is a 23 y.o. G0P0000 whose LMP was Patient's last menstrual period was 10/23/2023 (approximate)., presents today for NP eval irregular menses on POPs, ref by PCP. Started them 4 yrs ago after AUB with nexplanon . Hx of visual migraine once in past; recommended not to have estrogen BC. Hx of irregular menses prior to [facility]. Menses are Q 2 1/2-4 wks, lasting 2-5 days, sometimes spotting only, sometimes a few days of mod flow, no dysmen. Sx always this way on these POPs.  She is sexually active, no pain/bleeding/dryness. No recent pap/STD testing.  There is a FH of breast cancer in her MGM, genetic testing not done.   Patient Active Problem List   Diagnosis Date Noted   Encounter for routine adult medical exam with abnormal findings 10/01/2023   Irregular periods/menstrual cycles 10/01/2023   Family history of breast cancer 08/04/2019   Sleep disturbance 05/19/2018   Frequent headaches 05/19/2018   Vaginal discharge 08/27/2017   Disordered eating 01/06/2017   Adjustment disorder with mixed anxiety and depressed mood 01/06/2017    Past Surgical History:  Procedure Laterality Date   WISDOM TOOTH EXTRACTION      Family History  Problem Relation Age of Onset   Hyperlipidemia Mother    Depression Mother    Anxiety disorder Mother    Depression Sister    Anxiety disorder Sister    ADD / ADHD Sister    Cancer Maternal Aunt        unknown type   Anxiety disorder Maternal Aunt    Cancer Maternal Uncle    Diabetes Maternal Grandmother    COPD Maternal Grandmother    Breast cancer Maternal Grandmother        30s   COPD Paternal Grandfather    Cancer Other     Social History   Socioeconomic History   Marital status: Single    Spouse name: Not on file   Number of children: Not on file    Years of education: Not on file   Highest education level: 12th grade  Occupational History   Not on file  Tobacco Use   Smoking status: Never   Smokeless tobacco: Never  Vaping Use   Vaping status: Never Used  Substance and Sexual Activity   Alcohol use: Yes    Comment: soc   Drug use: Never   Sexual activity: Yes    Birth control/protection: Pill, Condom  Other Topics Concern   Not on file  Social History Narrative   Not on file   Social Drivers of Health   Financial Resource Strain: Low Risk  (09/30/2023)   Overall Financial Resource Strain (CARDIA)    Difficulty of Paying Living Expenses: Not hard at all  Food Insecurity: No Food Insecurity (09/30/2023)   Hunger Vital Sign    Worried About Running Out of Food in the Last Year: Never true    Ran Out of Food in the Last Year: Never true  Transportation Needs: No Transportation Needs (09/30/2023)   PRAPARE - Administrator, Civil Service (Medical): No    Lack of Transportation (Non-Medical): No  Physical Activity: Insufficiently Active (09/30/2023)   Exercise Vital Sign    Days of Exercise per Week: 7  days    Minutes of Exercise per Session: 20 min  Stress: No Stress Concern Present (09/30/2023)   [employer] of Occupational Health - Occupational Stress Questionnaire    Feeling of Stress : Only a little  Social Connections: Moderately Isolated (09/30/2023)   Social Connection and Isolation Panel [NHANES]    Frequency of Communication with Friends and Family: Once a week    Frequency of Social Gatherings with Friends and Family: More than three times a week    Attends Religious Services: Never    Database Administrator or Organizations: Yes    Attends Engineer, Structural: More than 4 times per year    Marital Status: Never married  Intimate Partner Violence: Unknown (01/02/2022)   Received from [employer], [facility]   HITS    Physically Hurt: Not on file    Insult or Talk Down To: Not on file     Threaten Physical Harm: Not on file    Scream or Curse: Not on file    Outpatient Medications Prior to Visit  Medication Sig Dispense Refill   FLUoxetine  (PROZAC ) 40 MG capsule TAKE 1 CAPSULE (40 MG TOTAL) BY MOUTH DAILY. 90 capsule 4   HEATHER  0.35 MG tablet Take 1 tablet (0.35 mg total) by mouth daily. 84 tablet 3   hydrOXYzine  (ATARAX ) 50 MG tablet Take 50 mg by mouth once as needed for anxiety. 90 tablet 0   No facility-administered medications prior to visit.      ROS:  Review of Systems  Constitutional:  Negative for fever.  Gastrointestinal:  Negative for blood in stool, constipation, diarrhea, nausea and vomiting.  Genitourinary:  Positive for vaginal bleeding. Negative for dyspareunia, dysuria, flank pain, frequency, hematuria, urgency, vaginal discharge and vaginal pain.  Musculoskeletal:  Negative for back pain.  Skin:  Negative for rash.   BREAST: No symptoms   OBJECTIVE:   Vitals:  BP (!) 89/53   Pulse 76   Ht 5' 2 (1.575 m)   Wt 110 lb (49.9 kg)   LMP 10/23/2023 (Approximate)   BMI 20.12 kg/m   Physical Exam Vitals reviewed.  Constitutional:      Appearance: She is well-developed.  Pulmonary:     Effort: Pulmonary effort is normal.  Genitourinary:    General: Normal vulva.     Pubic Area: No rash.      Labia:        Right: No rash, tenderness or lesion.        Left: No rash, tenderness or lesion.      Vagina: Normal. No vaginal discharge, erythema or tenderness.     Cervix: Normal.     Uterus: Normal. Not enlarged and not tender.      Adnexa: Right adnexa normal and left adnexa normal.       Right: No mass or tenderness.         Left: No mass or tenderness.    Musculoskeletal:        General: Normal range of motion.     Cervical back: Normal range of motion.  Skin:    General: Skin is warm and dry.  Neurological:     General: No focal deficit present.     Mental Status: She is alert and oriented to person, place, and time.   Psychiatric:        Mood and Affect: Mood normal.        Behavior: Behavior normal.        Thought  Content: Thought content normal.        Judgment: Judgment normal.     Assessment/Plan: Irregular menses--on POPs. Reassurance that this is normal. Offered to try Rx slynd but pt ok with current Rx. Also discussed IUD vs depo. F/u prn.   Encounter for surveillance of contraceptive pills--Rx through PCP  Cervical cancer screening - Plan: Cytology - PAP  Screening for STD (sexually transmitted disease) - Plan: Cytology - PAP  Family history of breast cancer--MyRisk testing discussed and handout given. Pt to f/u prn.     Return if symptoms worsen or fail to improve.  Cray Monnin B. Abrie Egloff, PA-C 11/02/2023 4:01 PM

## 2023-11-02 ENCOUNTER — Ambulatory Visit: Payer: 59 | Admitting: Obstetrics and Gynecology

## 2023-11-02 ENCOUNTER — Other Ambulatory Visit (HOSPITAL_COMMUNITY)
Admission: RE | Admit: 2023-11-02 | Discharge: 2023-11-02 | Disposition: A | Discharge status: Home / Self Care | Payer: 59 | Source: Ambulatory Visit | Attending: Obstetrics and Gynecology | Admitting: Obstetrics and Gynecology

## 2023-11-02 ENCOUNTER — Encounter: Payer: Self-pay | Admitting: Obstetrics and Gynecology

## 2023-11-02 VITALS — BP 89/53 | HR 76 | Ht 62.0 in | Wt 110.0 lb

## 2023-11-02 DIAGNOSIS — Z3041 Encounter for surveillance of contraceptive pills: Secondary | ICD-10-CM

## 2023-11-02 DIAGNOSIS — Z124 Encounter for screening for malignant neoplasm of cervix: Secondary | ICD-10-CM

## 2023-11-02 DIAGNOSIS — Z113 Encounter for screening for infections with a predominantly sexual mode of transmission: Secondary | ICD-10-CM

## 2023-11-02 DIAGNOSIS — N926 Irregular menstruation, unspecified: Secondary | ICD-10-CM

## 2023-11-02 DIAGNOSIS — Z803 Family history of malignant neoplasm of breast: Secondary | ICD-10-CM

## 2023-11-02 NOTE — Patient Instructions (Signed)
 I value your feedback and you entrusting [place] with your care. If you get a  patient survey, I would appreciate you taking the time to let us know about your experience today. Thank you! ? ? ?

## 2023-11-08 LAB — CYTOLOGY - PAP
Chlamydia: NEGATIVE
Comment: NEGATIVE
Comment: NORMAL
Diagnosis: NEGATIVE
Neisseria Gonorrhea: NEGATIVE

## 2023-12-09 ENCOUNTER — Encounter: Payer: Self-pay | Admitting: Nurse Practitioner

## 2023-12-09 MED ORDER — HEATHER 0.35 MG PO TABS: 1.0000 | ORAL_TABLET | Freq: Every day | ORAL | 3 refills | Status: DC

## 2024-03-07 ENCOUNTER — Encounter: Payer: Self-pay | Admitting: Nurse Practitioner

## 2024-03-07 MED ORDER — HEATHER 0.35 MG PO TABS
1.0000 | ORAL_TABLET | Freq: Every day | ORAL | 3 refills | Status: AC
Start: 2024-03-07 — End: ?

## 2024-05-31 ENCOUNTER — Encounter: Payer: Self-pay | Admitting: Nurse Practitioner

## 2024-06-01 ENCOUNTER — Other Ambulatory Visit: Payer: Self-pay | Admitting: Family

## 2024-06-01 DIAGNOSIS — F4323 Adjustment disorder with mixed anxiety and depressed mood: Secondary | ICD-10-CM

## 2024-06-01 NOTE — Telephone Encounter (Signed)
 Noted

## 2024-06-26 ENCOUNTER — Telehealth: Payer: Self-pay | Admitting: Nurse Practitioner

## 2024-06-26 NOTE — Telephone Encounter (Signed)
 Lm that appointment on 10/03/2024 time has been changed from 10 am to 9:40am.  MyChart message sent.

## 2024-10-03 ENCOUNTER — Ambulatory Visit: Admitting: Nurse Practitioner

## 2024-10-03 ENCOUNTER — Ambulatory Visit: Payer: 59 | Admitting: Nurse Practitioner

## 2024-10-03 ENCOUNTER — Encounter: Payer: Self-pay | Admitting: Nurse Practitioner

## 2024-10-03 VITALS — BP 98/58 | HR 63 | Temp 98.1°F | Ht 62.0 in | Wt 125.8 lb

## 2024-10-03 DIAGNOSIS — F4323 Adjustment disorder with mixed anxiety and depressed mood: Secondary | ICD-10-CM

## 2024-10-03 DIAGNOSIS — Z0001 Encounter for general adult medical examination with abnormal findings: Secondary | ICD-10-CM

## 2024-10-03 DIAGNOSIS — B360 Pityriasis versicolor: Secondary | ICD-10-CM

## 2024-10-03 DIAGNOSIS — Z23 Encounter for immunization: Secondary | ICD-10-CM

## 2024-10-03 MED ORDER — SELENIUM SULFIDE 2.5 % EX LOTN: 1.0000 | TOPICAL_LOTION | Freq: Every day | CUTANEOUS | 5 refills | Status: AC

## 2024-10-03 MED ORDER — FLUOXETINE HCL 40 MG PO CAPS: 40.0000 mg | ORAL_CAPSULE | Freq: Every day | ORAL | 3 refills | Status: AC

## 2024-10-03 NOTE — Progress Notes (Signed)
 " Leron Glance, NP-C Phone: [phone]  Melody [name] is a 24 y.o. female who presents today for annual exam.   Discussed the use of AI scribe software for clinical note transcription with the patient, who gave verbal consent to proceed.  History of Present Illness   Melody [name] is a 24 year old female who presents for an annual physical exam.  She has no new medical concerns since her last visit a year ago. 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  She is established with an OB GYN and has an up-to-date Pap smear. She continues to use the same birth control and reports regular periods, though they are heavier than before but manageable.  She is currently taking Prozac  40 mg and has a stable mood. She does not require a refill at this time.  She received a flu vaccine recently and is up to date with her tetanus vaccine, having received one in 2023. She has had multiple COVID vaccines.  She does not smoke, drinks alcohol occasionally (about once a month), and does not use drugs. She maintains a balanced diet and exercises regularly, including gym workouts, line dancing, and pole dancing.  She has a history of frequent strep throat infections during childhood but no current issues. She mentions a skin condition on her back, described as a loss of color, which started over the summer and persists.  Her sleep pattern is generally normal, with 7-8 hours of sleep per night, though she wakes up during the night. She has been staying up late and sleeping in during breaks.  Family history is significant for breast cancer, but there is no known history of ovarian or colon cancer.      Tobacco Use History[1]  Medications Ordered Prior to Encounter[2]   ROS see history of present illness  Objective  Physical Exam Vitals:   10/03/24 0944  BP: (!) 98/58  Pulse: 63  Temp: 98.1 F (36.7 C)  SpO2: 99%    BP Readings from Last 3  Encounters:  10/03/24 (!) 98/58  11/02/23 (!) 89/53  10/01/23 100/64   Wt Readings from Last 3 Encounters:  10/03/24 125 lb 12.8 oz (57.1 kg)  11/02/23 110 lb (49.9 kg)  10/01/23 110 lb 9.6 oz (50.2 kg)    Physical Exam Constitutional:      General: She is not in acute distress.    Appearance: Normal appearance.  HENT:     Head: Normocephalic.     Right Ear: Tympanic membrane normal.     Left Ear: Tympanic membrane normal.     Nose: Nose normal.     Mouth/Throat:     Mouth: Mucous membranes are moist.     Pharynx: Oropharynx is clear.  Eyes:     Conjunctiva/sclera: Conjunctivae normal.     Pupils: Pupils are equal, round, and reactive to light.  Neck:     Thyroid : No thyromegaly.  Cardiovascular:     Rate and Rhythm: Normal rate and regular rhythm.     Heart sounds: Normal heart sounds.  Pulmonary:     Effort: Pulmonary effort is normal.     Breath sounds: Normal breath sounds.  Abdominal:     General: Abdomen is flat. Bowel sounds are normal.     Palpations: Abdomen is soft. There is no mass.     Tenderness: There is no abdominal tenderness.  Musculoskeletal:        General: Normal range of  motion.  Lymphadenopathy:     Cervical: No cervical adenopathy.  Skin:    General: Skin is warm and dry.     Findings: Rash present.         Comments: light colored patches noted across lower back consistent with tinea versicolor  Neurological:     General: No focal deficit present.     Mental Status: She is alert.  Psychiatric:        Mood and Affect: Mood normal.        Behavior: Behavior normal.      Assessment/Plan: Please see individual problem list.  Encounter for routine adult medical exam with abnormal findings Assessment & Plan: Physical exam complete. Lab work deferred today. Pap smear is up to date. Flu vaccine administered today. Tetanus and COVID vaccines are up to date. Continue routine dental and eye exams. Encourage healthy diet and regular exercise.  Return to care in one year, sooner as needed.    Tinea versicolor Assessment & Plan: Discoloration on the back consistent with tinea versicolor. No itching reported. Trial selenium  sulfide topically daily. Consider referral to Derm if worsening or persisting.   Orders: -     Selenium  Sulfide; Apply 1 Application topically daily. X 1 week then as needed. Lather then rinse off after 10 minutes.  Dispense: 118 mL; Refill: 5  Adjustment disorder with mixed anxiety and depressed mood Assessment & Plan: Mood is well-managed on Prozac  40 mg. No current symptoms of anxiety or depression reported. Continue Prozac  40 mg daily. Refills sent. Encouraged to contact if worsening symptoms, unusual behavior changes or suicidal thoughts occur.   Orders: -     FLUoxetine  HCl; Take 1 capsule (40 mg total) by mouth daily.  Dispense: 90 capsule; Refill: 3  Need for influenza vaccination -     Flu vaccine trivalent PF, 6mos and older(Flulaval,Afluria,Fluarix,Fluzone)     Return in about 1 year (around 10/03/2025) for Annual Exam, sooner as needed.   Leron Glance, NP-C [facility] [place]     [1]  Social History Tobacco Use  Smoking Status Never  Smokeless Tobacco Never  [2]  Current Outpatient Medications on File Prior to Visit  Medication Sig Dispense Refill   HEATHER  0.35 MG tablet Take 1 tablet (0.35 mg total) by mouth daily. 84 tablet 3   hydrOXYzine  (ATARAX ) 50 MG tablet Take 50 mg by mouth once as needed for anxiety. (Patient taking differently: daily as needed for anxiety. Take 50 mg by mouth once as needed for anxiety.) 90 tablet 0   No current facility-administered medications on file prior to visit.   "

## 2024-10-03 NOTE — Assessment & Plan Note (Signed)
 Physical exam complete. Lab work deferred today. Pap smear is up to date. Flu vaccine administered today. Tetanus and COVID vaccines are up to date. Continue routine dental and eye exams. Encourage healthy diet and regular exercise. Return to care in one year, sooner as needed.

## 2024-10-03 NOTE — Assessment & Plan Note (Signed)
 Discoloration on the back consistent with tinea versicolor. No itching reported. Trial selenium  sulfide topically daily. Consider referral to Derm if worsening or persisting.

## 2024-10-03 NOTE — Assessment & Plan Note (Signed)
 Mood is well-managed on Prozac  40 mg. No current symptoms of anxiety or depression reported. Continue Prozac  40 mg daily. Refills sent. Encouraged to contact if worsening symptoms, unusual behavior changes or suicidal thoughts occur.

## 2025-10-05 ENCOUNTER — Encounter: Admitting: Nurse Practitioner
